# Patient Record
Sex: Female | Born: 2000 | Race: Black or African American | Hispanic: No | Marital: Single | Smoking: Never smoker
Health system: Southern US, Community
[De-identification: ages and names within clinical notes are randomized; demographics above are authoritative.]

---
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
## 2021-07-21 DIAGNOSIS — F312 Bipolar disorder, current episode manic severe with psychotic features: Secondary | ICD-10-CM | POA: Insufficient documentation

## 2021-08-01 ENCOUNTER — Ambulatory Visit (HOSPITAL_COMMUNITY)
Admission: EM | Admit: 2021-08-01 | Discharge: 2021-08-01 | Disposition: A | Discharge status: Other Acute Inpatient Hospital | Payer: 59 | Source: Home / Self Care

## 2021-08-01 ENCOUNTER — Emergency Department (HOSPITAL_COMMUNITY)
Admission: EM | Admit: 2021-08-01 | Discharge: 2021-08-02 | Disposition: A | Discharge status: Other Acute Inpatient Hospital | Payer: 59 | Source: Home / Self Care | Attending: Emergency Medicine | Admitting: Emergency Medicine

## 2021-08-01 DIAGNOSIS — Z79899 Other long term (current) drug therapy: Secondary | ICD-10-CM | POA: Insufficient documentation

## 2021-08-01 DIAGNOSIS — F3163 Bipolar disorder, current episode mixed, severe, without psychotic features: Secondary | ICD-10-CM | POA: Diagnosis not present

## 2021-08-01 DIAGNOSIS — Z046 Encounter for general psychiatric examination, requested by authority: Secondary | ICD-10-CM | POA: Insufficient documentation

## 2021-08-01 DIAGNOSIS — Z20822 Contact with and (suspected) exposure to covid-19: Secondary | ICD-10-CM | POA: Insufficient documentation

## 2021-08-01 DIAGNOSIS — F1290 Cannabis use, unspecified, uncomplicated: Secondary | ICD-10-CM | POA: Insufficient documentation

## 2021-08-01 DIAGNOSIS — F3164 Bipolar disorder, current episode mixed, severe, with psychotic features: Secondary | ICD-10-CM | POA: Insufficient documentation

## 2021-08-01 DIAGNOSIS — R000 Tachycardia, unspecified: Secondary | ICD-10-CM | POA: Insufficient documentation

## 2021-08-01 DIAGNOSIS — L03115 Cellulitis of right lower limb: Secondary | ICD-10-CM | POA: Insufficient documentation

## 2021-08-01 DIAGNOSIS — D72829 Elevated white blood cell count, unspecified: Secondary | ICD-10-CM | POA: Insufficient documentation

## 2021-08-01 DIAGNOSIS — R45851 Suicidal ideations: Secondary | ICD-10-CM | POA: Diagnosis not present

## 2021-08-01 LAB — COMPREHENSIVE METABOLIC PANEL
ALT: 25 U/L (ref 0–44)
AST: 31 U/L (ref 15–41)
Albumin: 4.5 g/dL (ref 3.5–5.0)
Alkaline Phosphatase: 49 U/L (ref 38–126)
Anion gap: 14 (ref 5–15)
BUN: 9 mg/dL (ref 6–20)
CO2: 22 mmol/L (ref 22–32)
Calcium: 10 mg/dL (ref 8.9–10.3)
Chloride: 103 mmol/L (ref 98–111)
Creatinine, Ser: 0.98 mg/dL (ref 0.44–1.00)
GFR, Estimated: >60 mL/min (ref >60)
Glucose, Bld: 78 mg/dL (ref 70–99)
Potassium: 3.9 mmol/L (ref 3.5–5.1)
Sodium: 139 mmol/L (ref 135–145)
Total Bilirubin: 0.9 mg/dL (ref 0.3–1.2)
Total Protein: 8.3 g/dL — ABNORMAL HIGH (ref 6.5–8.1)

## 2021-08-01 LAB — CBC WITH DIFFERENTIAL/PLATELET
Abs Immature Granulocytes: 0.06 10*3/uL (ref 0.00–0.07)
Basophils Absolute: 0 10*3/uL (ref 0.0–0.1)
Basophils Relative: 0 %
Eosinophils Absolute: 0 10*3/uL (ref 0.0–0.5)
Eosinophils Relative: 0 %
HCT: 39.8 % (ref 36.0–46.0)
Hemoglobin: 12.9 g/dL (ref 12.0–15.0)
Immature Granulocytes: 1 %
Lymphocytes Relative: 14 %
Lymphs Abs: 1.8 10*3/uL (ref 0.7–4.0)
MCH: 30.6 pg (ref 26.0–34.0)
MCHC: 32.4 g/dL (ref 30.0–36.0)
MCV: 94.3 fL (ref 80.0–100.0)
Monocytes Absolute: 1.4 10*3/uL — ABNORMAL HIGH (ref 0.1–1.0)
Monocytes Relative: 10 %
Neutro Abs: 9.8 10*3/uL — ABNORMAL HIGH (ref 1.7–7.7)
Neutrophils Relative %: 75 %
Platelets: 293 10*3/uL (ref 150–400)
RBC: 4.22 MIL/uL (ref 3.87–5.11)
RDW: 13.2 % (ref 11.5–15.5)
WBC: 13.1 10*3/uL — ABNORMAL HIGH (ref 4.0–10.5)
nRBC: 0 % (ref 0.0–0.2)

## 2021-08-01 LAB — POC SARS CORONAVIRUS 2 AG -  ED: SARS Coronavirus 2 Ag: NEGATIVE

## 2021-08-01 LAB — LIPID PANEL
Cholesterol: 199 mg/dL (ref 0–200)
HDL: 123 mg/dL (ref >40)
LDL Cholesterol: 68 mg/dL (ref 0–99)
Total CHOL/HDL Ratio: 1.6 RATIO
Triglycerides: 39 mg/dL (ref <150)
VLDL: 8 mg/dL (ref 0–40)

## 2021-08-01 LAB — TSH: TSH: 1.457 u[IU]/mL (ref 0.350–4.500)

## 2021-08-01 LAB — RESP PANEL BY RT-PCR (FLU A&B, COVID) ARPGX2
Influenza A by PCR: NEGATIVE
Influenza B by PCR: NEGATIVE
SARS Coronavirus 2 by RT PCR: NEGATIVE

## 2021-08-01 LAB — HEMOGLOBIN A1C
Hgb A1c MFr Bld: 5 % (ref 4.8–5.6)
Mean Plasma Glucose: 96.8 mg/dL

## 2021-08-01 LAB — MAGNESIUM: Magnesium: 2 mg/dL (ref 1.7–2.4)

## 2021-08-01 LAB — POC SARS CORONAVIRUS 2 AG: SARSCOV2ONAVIRUS 2 AG: NEGATIVE

## 2021-08-01 LAB — ETHANOL: Alcohol, Ethyl (B): <10 mg/dL (ref <10)

## 2021-08-01 LAB — LITHIUM LEVEL: Lithium Lvl: 0.52 mmol/L — ABNORMAL LOW (ref 0.60–1.20)

## 2021-08-01 MED ORDER — TRAZODONE HCL 50 MG PO TABS: 50.0000 mg | ORAL_TABLET | Freq: Every evening | ORAL | Status: DC | PRN

## 2021-08-01 MED ORDER — HYDROXYZINE HCL 25 MG PO TABS: 25.0000 mg | ORAL_TABLET | Freq: Three times a day (TID) | ORAL | Status: DC | PRN

## 2021-08-01 MED ORDER — OLANZAPINE 5 MG PO TABS
5.0000 mg | ORAL_TABLET | Freq: Two times a day (BID) | ORAL | Status: DC
  Administered 2021-08-01: 5 mg via ORAL
  Filled 2021-08-01: qty 1

## 2021-08-01 MED ORDER — CEPHALEXIN 250 MG PO CAPS
500.0000 mg | ORAL_CAPSULE | Freq: Once | ORAL | Status: AC
  Administered 2021-08-01: 500 mg via ORAL
  Filled 2021-08-01: qty 2

## 2021-08-01 MED ORDER — MAGNESIUM HYDROXIDE 400 MG/5ML PO SUSP: 30.0000 mL | Freq: Every day | ORAL | Status: DC | PRN

## 2021-08-01 MED ORDER — ALUM & MAG HYDROXIDE-SIMETH 200-200-20 MG/5ML PO SUSP: 30.0000 mL | ORAL | Status: DC | PRN

## 2021-08-01 MED ORDER — ACETAMINOPHEN 325 MG PO TABS: 650.0000 mg | ORAL_TABLET | Freq: Four times a day (QID) | ORAL | Status: DC | PRN

## 2021-08-01 NOTE — ED Notes (Signed)
Report given to [facility] [email or web address]  ED

## 2021-08-01 NOTE — ED Provider Notes (Addendum)
Behavioral Health Admission [facility])  Date: 08/01/21 Patient Name: Kaitlyn Cline MRN: [id number] Chief Complaint:  Chief Complaint  Patient presents with   IVC      Diagnoses:  Final diagnoses:  Bipolar 1 disorder, mixed, severe (HCC)    HPI: Kaitlyn Cline is a 20 year old female.  Patient presents involuntarily to [facility] behavioral health for walk-in assessment.  Patient is transported by police.  Involuntary commitment petition, initiated by roommate, Timothy Lasso,  reads: "Respondent has stated on multiple occasions she will kill herself.  Respondent has been prescribed lithium and mirtazapine.  According to respondent's roommate respondent is paranoid.  Respondent is very aggressive and hostile.  Threatens to fight all her roommates in the home.  Respondent also threatened to blow up the home.  Respondent moves through multiple personalities going from extremely angry to cold.  Respondent is a danger to themselves and others.  Patient is assessed face-to-face by nurse practitioner.  She is seated in assessment area, no acute distress.  She is alert and oriented, participates actively in assessment.  Kaitlyn states "I need help."  She states "mentally I am stressed." She denies making suicidal statements.  Recent stressors include school, she has recently attended [employer], reports she is premed and biology courses are "too rigorous."  She is not enrolled in university currently, plans to re-enroll for Spring 2023 semester. 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 She endorses history of "I do not know how to love, my family is an emotional roller coaster." She reports financial concerns and family and natural supports long distance in [place]. [place].   Kaitlyn has been diagnosed with bipolar 1 disorder.  She was discharged from inpatient psychiatric treatment [facility] 1 week ago.  She reports she has  been compliant with medications including lithium 300 mg twice daily and Remeron 15 mg nightly. She appears to make contradictory statements regarding medication compliance.  She is a poor historian regarding outpatient psychiatry follow-up at this time.  She presents with tangential conversation states "I am not a racist."  She appears concerned versus paranoid regarding her current medications.  She states "I am too young for lithium, it is a strong medication."  Additionally she reports she has a small abrasion to her right hip, attributes this to lithium. Kaitlyn reports apparent paranoid ideations states "I feel like a sore is coming up on my arm." She exposes right forearm, skin appears intact.   Kaitlyn depressed and anxious mood, tearful affect. She denies suicidal and homicidal ideations.  She denies history of suicide attempts, denies history of self-harm.   She denies both auditory and visual hallucinations.  Patient is able to converse coherently with goal-directed thoughts.     Kaitlyn resides in [place] with roommates, she denies access to weapons. She is employed in [employer], currently on leave of absence that began on 07/22/2021. She denies alcohol and substance use. Reports typically uses marijuana, last use marijuana on 07/27/2021. Patient endorses average sleep and appetite.  Patient offered support and encouragement.    PHQ 2-9:     Total Time spent with patient: 30 minutes  Musculoskeletal  Strength & Muscle Tone: within normal limits Gait & Station: normal Patient leans: N/A  Psychiatric Specialty Exam  Presentation General Appearance: Appropriate for Environment; Casual  Eye Contact:Good  Speech:Clear and Coherent; Normal Rate  Speech Volume:Normal  Handedness:Right   Mood and Affect  Mood:Anxious;  Depressed  Affect:Tearful   Thought Process  Thought Processes:Coherent; Goal Directed  Descriptions of Associations:Tangential  Orientation:Full  (Time, Place and Person)  Thought Content:Logical; Tangential    Hallucinations:Hallucinations: None  Ideas of Reference:None  Suicidal Thoughts:Suicidal Thoughts: No  Homicidal Thoughts:Homicidal Thoughts: No   Sensorium  Memory:Immediate Good; Recent Fair; Remote Fair  Judgment:Fair  Insight:Shallow   Executive Functions  Concentration:Fair  Attention Span:Fair  Recall:Good  Fund of Knowledge:Good  Language:Good   Psychomotor Activity  Psychomotor Activity:Psychomotor Activity: Normal   Assets  Assets:Communication Skills; Desire for Improvement; Housing; Intimacy; Leisure Time; Physical Health; Resilience; Social Support   Sleep  Sleep:Sleep: Fair   Nutritional Assessment (For OBS and [facility] admissions only) Has the patient had a weight loss or gain of 10 pounds or more in the last 3 months?: No Has the patient had a decrease in food intake/or appetite?: No Does the patient have dental problems?: No Does the patient have eating habits or behaviors that may be indicators of an eating disorder including binging or inducing vomiting?: No Has the patient recently lost weight without trying?: 0 Has the patient been eating poorly because of a decreased appetite?: 0 Malnutrition Screening Tool Score: 0   Physical Exam Vitals and nursing note reviewed.  Constitutional:      Appearance: Normal appearance. She is well-developed and normal weight.  HENT:     Head: Normocephalic and atraumatic.     Nose: Nose normal.  Cardiovascular:     Rate and Rhythm: Normal rate.  Pulmonary:     Effort: Pulmonary effort is normal.  Musculoskeletal:        General: Normal range of motion.     Cervical back: Normal range of motion.  Skin:    General: Skin is warm and dry.     Comments: Small abrasion noted to anterior right hip  Neurological:     Mental Status: She is alert and oriented to person, place, and time.  Psychiatric:        Attention and Perception:  Attention and perception normal.        Mood and Affect: Mood is anxious and depressed. Affect is tearful.        Speech: Speech is tangential.        Behavior: Behavior is cooperative.        Thought Content: Thought content is paranoid.        Cognition and Memory: Cognition and memory normal.   Review of Systems  Constitutional: Negative.   HENT: Negative.    Eyes: Negative.   Respiratory: Negative.    Cardiovascular: Negative.   Gastrointestinal: Negative.   Genitourinary: Negative.   Musculoskeletal: Negative.   Skin: Negative.   Neurological: Negative.   Endo/Heme/Allergies: Negative.   Psychiatric/Behavioral:  Positive for depression. The patient is nervous/anxious.    Blood pressure 133/80, pulse (!) 118, temperature 99 F (37.2 C), temperature source Oral, resp. rate 20, SpO2 98 %. There is no height or weight on file to calculate BMI.  Past Psychiatric History: bipolar 1 disorder, cannabis use disorder   Is the patient at risk to self? No  Has the patient been a risk to self in the past 6 months? No .    Has the patient been a risk to self within the distant past? No   Is the patient a risk to others? No   Has the patient been a risk to others in the past 6 months? No   Has the patient been a risk  to others within the distant past? No   Past Medical History: No past medical history on file.  Family History: No family history on file.  Social History:  Social History   Socioeconomic History   Marital status: Single    Spouse name: Not on file   Number of children: Not on file   Years of education: Not on file   Highest education level: Not on file  Occupational History   Not on file  Tobacco Use   Smoking status: Not on file   Smokeless tobacco: Not on file  Substance and Sexual Activity   Alcohol use: Not on file   Drug use: Not on file   Sexual activity: Not on file  Other Topics Concern   Not on file  Social History Narrative   Not on file    Social Determinants of Health   Financial Resource Strain: Not on file  Food Insecurity: Not on file  Transportation Needs: Not on file  Physical Activity: Not on file  Stress: Not on file  Social Connections: Not on file  Intimate Partner Violence: Not on file    SDOH:  SDOH Screenings   Alcohol Screen: Not on file  Depression (PHQ2-9): Not on file  Financial Resource Strain: Not on file  Food Insecurity: Not on file  Housing: Not on file  Physical Activity: Not on file  Social Connections: Not on file  Stress: Not on file  Tobacco Use: Not on file  Transportation Needs: Not on file    Last Labs:  No results found for any previous visit.    Allergies: Patient has no allergy information on record.  PTA Medications: (Not in a hospital admission)   Medical Decision Making  Patient reviewed with Dr. Bronwen Betters.  Inpatient psychiatric treatment recommended.  Patient remains under involuntary commitment petition. Kaitlyn will be placed on continuous observation unit at [facility] behavioral health while awaiting inpatient psychiatric admission.  Laboratory studies ordered including CBC, CMP, ethanol, A1c, hepatic function, lipid panel, magnesium and TSH.  Urine pregnancy and urine drug screen ordered.  EKG order initiated.  Current medications: -Acetaminophen 650 mg every 6 as needed/mild pain -Maalox 30 mL oral every 4 as needed/digestion -Hydroxyzine 25 mg 3 times daily as needed/anxiety -Magnesium hydroxide 30 mL daily as needed/mild constipation -Trazodone 50 mg nightly as needed/sleep -Olanzapine 5mg  BID/mood      Recommendations  Based on my evaluation the patient appears to have an emergency medical condition for which I recommend the patient be transferred to the emergency department for further evaluation. Patient with elevated heart rate and abrasion to right hip. Patient reports tenderness and warmth to right hip. Abrasion noted to be raised and reddened.  Patient will be transported to emergency department for medical clearance. Once medically cleared she can return to [facility] health to await inpatient psychiatric admission. Patient verbalizes agreement with plan for emergency department evaluation.    [facility], FNP 08/01/21  8:11 PM
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
## 2021-08-01 NOTE — ED Notes (Signed)
Dash called for stat pickup to  lab 

## 2021-08-01 NOTE — Progress Notes (Signed)
Inpatient Behavioral Health Placement  Pt meets inpatient criteria per Doran Heater, FNP.  Per [facility] Fransico Michael, RN there are no appropriate beds.  Referral was sent to the following facilities;   Destination Service Provider Address Phone Fax  [facility]  [address] [place] [address] [phone]  [facility] [address]  [address] [place] [address] [phone]  [facility]  [address] [place], [place] [address] [phone]  [facility]  [address] [place] [address] [phone]  [facility]  [address] [place] [address] [place] [address] [phone]  [facility]  [address] [place] [address] [phone]  [facility]  [address], [place] [address] [phone]  [facility]  [address], [place] [address] [phone]  [facility]  [address], [place] [address] [phone]  [facility]  [address] [place] [address] [phone]  [facility]  [address], [place] [address] [phone]  [facility]  [address], [place] [address] [phone]  [facility]  [address], [place] [address] [phone]  [facility]  [address] [place], [place] [address] [phone]  [facility]  [address], [place] [address] [phone]  [facility]  [address] [place] [address] [phone]    Situation ongoing,  CSW will follow up.   Maryjean Ka,  MSW, [facility] 08/01/2021  @ 11:48 PM

## 2021-08-01 NOTE — ED Notes (Signed)
Pt belongings are still at [facility] until pt return. Pt was given 5mg  of zyprexa
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
## 2021-08-01 NOTE — ED Triage Notes (Signed)
Patient came from [facility]. Received IM in right leg. Cx painful burning around injection site.

## 2021-08-01 NOTE — ED Provider Notes (Signed)
[facility] EMERGENCY DEPARTMENT Provider Note   CSN: [id number] Arrival date & time: 08/01/21  2210     History Chief Complaint  Patient presents with   Leg Pain    Kaitlyn Cline is a 20 y.o. female.  20 year old female with a history of bipolar 1 disorder presents from [facility] for evaluation of R hip wound. Patient reports wound being present x 2 days. It has been constant, worsening; has become more red and swollen over the past day. Describes a "burning" pain associated with her wound. Notes a blister to the area which has popped. Cannot tell me how the wound began; alleges that it erupted because her medication dosages were "too high". Denies fever, drainage. Presently under IVC awaiting psychiatric placement.  The history is provided by the patient and medical records. No language interpreter was used.  Leg Pain     No past medical history on file.  There are no problems to display for this patient.   ** The histories are not reviewed yet. Please review them in the "History" navigator section and refresh this SmartLink.   OB History   No obstetric history on file.     No family history on file.     Home Medications Prior to Admission medications   Not on File    Allergies    Patient has no allergy information on record.  Review of Systems   Review of Systems Ten systems reviewed and are negative for acute change, except as noted in the HPI.    Physical Exam Updated Vital Signs BP 118/79 (BP Location: Right Arm)   Pulse 99   Temp 98.1 F (36.7 C) (Oral)   Resp 16   SpO2 100%   Physical Exam Vitals and nursing note reviewed.  Constitutional:      General: She is not in acute distress.    Appearance: She is well-developed. She is not diaphoretic.     Comments: Nontoxic appearing and in NAD  HENT:     Head: Normocephalic and atraumatic.  Eyes:     General: No scleral icterus.    Conjunctiva/sclera: Conjunctivae normal.   Cardiovascular:     Comments: Mild tachycardia Pulmonary:     Effort: Pulmonary effort is normal. No respiratory distress.  Musculoskeletal:        General: Normal range of motion.     Cervical back: Normal range of motion.     Comments: Preserved ROM of the RLE. Area of induration to the lateral R thigh/hip with associated erythema approximately 4cm in diameter. Warmth noted with small area of central desquamation c/w reported popped blister. No purulence, weeping, fluctuance, lymphangitic streaking.  Skin:    General: Skin is warm and dry.     Coloration: Skin is not pale.     Findings: No rash.  Neurological:     Mental Status: She is alert and oriented to person, place, and time.  Psychiatric:        Behavior: Behavior normal.    ED Results / Procedures / Treatments   Labs (all labs ordered are listed, but only abnormal results are displayed) Labs Reviewed  I-STAT BETA HCG BLOOD, ED ([facility], [facility], [facility] ONLY)    EKG None  Radiology No results found.  Procedures Procedures   Medications Ordered in ED Medications  cephALEXin (KEFLEX) capsule 500 mg (has no administration in time range)    ED Course  I have reviewed the triage vital signs and the nursing notes.  Pertinent  labs & imaging results that were available during my care of the patient were reviewed by me and considered in my medical decision making (see chart for details).    MDM Rules/Calculators/A&P                           Sent from [facility] for assessment of wound to R thigh x 2 days and medical clearance. Presently under IVC pending psych placement.  Based on chart review, new leukocytosis of 13.1 compared to 07/21/21. Tachycardic at [facility], but improving HR now. Unclear how much of these changes may be related to psychiatric crisis. Physical exam certainly suggestive of cellulitic process, though patient does not appear overtly toxic/septic. Started on Keflex PO in the ED. No physical exam findings suggestive of  drainable abscess. Will ensure no subcutaneous emphysema; xray ordered. Care signed out to [place], [place] at shift change pending reassessment. Stable for clearance back to [facility] on course of PO abx if imaging reassuring.   Final Clinical Impression(s) / ED Diagnoses Final diagnoses:  Cellulitis of right lower extremity    Rx / DC Orders ED Discharge Orders     None        Antony Madura, PA-C 08/01/21 2325    Arby Barrette, MD 08/03/21 1554

## 2021-08-02 ENCOUNTER — Ambulatory Visit (HOSPITAL_COMMUNITY)
Admission: EM | Admit: 2021-08-02 | Discharge: 2021-08-02 | Disposition: A | Discharge status: Other Acute Inpatient Hospital | Payer: 59 | Source: Home / Self Care

## 2021-08-02 ENCOUNTER — Encounter (HOSPITAL_COMMUNITY): Payer: Self-pay | Admitting: Student

## 2021-08-02 ENCOUNTER — Inpatient Hospital Stay (HOSPITAL_COMMUNITY)
Admission: AD | Admit: 2021-08-02 | Discharge: 2021-08-08 | DRG: 885 | Disposition: A | Discharge status: Home / Self Care | Payer: 59 | Source: Intra-hospital | Attending: Psychiatry | Admitting: Psychiatry

## 2021-08-02 ENCOUNTER — Emergency Department (HOSPITAL_COMMUNITY): Payer: 59

## 2021-08-02 ENCOUNTER — Other Ambulatory Visit: Payer: Self-pay

## 2021-08-02 ENCOUNTER — Encounter (HOSPITAL_COMMUNITY): Payer: Self-pay | Admitting: Psychiatry

## 2021-08-02 DIAGNOSIS — L03115 Cellulitis of right lower limb: Secondary | ICD-10-CM | POA: Diagnosis present

## 2021-08-02 DIAGNOSIS — F3110 Bipolar disorder, current episode manic without psychotic features, unspecified: Secondary | ICD-10-CM | POA: Diagnosis not present

## 2021-08-02 DIAGNOSIS — K30 Functional dyspepsia: Secondary | ICD-10-CM | POA: Diagnosis present

## 2021-08-02 DIAGNOSIS — Z20822 Contact with and (suspected) exposure to covid-19: Secondary | ICD-10-CM | POA: Diagnosis present

## 2021-08-02 DIAGNOSIS — F1290 Cannabis use, unspecified, uncomplicated: Secondary | ICD-10-CM | POA: Diagnosis present

## 2021-08-02 DIAGNOSIS — F3163 Bipolar disorder, current episode mixed, severe, without psychotic features: Secondary | ICD-10-CM

## 2021-08-02 DIAGNOSIS — Z79899 Other long term (current) drug therapy: Secondary | ICD-10-CM | POA: Insufficient documentation

## 2021-08-02 DIAGNOSIS — R000 Tachycardia, unspecified: Secondary | ICD-10-CM | POA: Insufficient documentation

## 2021-08-02 DIAGNOSIS — R45851 Suicidal ideations: Secondary | ICD-10-CM | POA: Diagnosis present

## 2021-08-02 DIAGNOSIS — F312 Bipolar disorder, current episode manic severe with psychotic features: Secondary | ICD-10-CM

## 2021-08-02 DIAGNOSIS — F319 Bipolar disorder, unspecified: Secondary | ICD-10-CM | POA: Diagnosis not present

## 2021-08-02 DIAGNOSIS — F419 Anxiety disorder, unspecified: Secondary | ICD-10-CM | POA: Diagnosis present

## 2021-08-02 DIAGNOSIS — F3164 Bipolar disorder, current episode mixed, severe, with psychotic features: Secondary | ICD-10-CM | POA: Diagnosis present

## 2021-08-02 DIAGNOSIS — D72829 Elevated white blood cell count, unspecified: Secondary | ICD-10-CM | POA: Diagnosis present

## 2021-08-02 LAB — I-STAT BETA HCG BLOOD, ED (MC, WL, AP ONLY): I-stat hCG, quantitative: 5 m[IU]/mL — ABNORMAL HIGH (ref <5)

## 2021-08-02 MED ORDER — OLANZAPINE 5 MG PO TABS
5.0000 mg | ORAL_TABLET | Freq: Two times a day (BID) | ORAL | Status: DC
  Administered 2021-08-02: 5 mg via ORAL
  Filled 2021-08-02: qty 1

## 2021-08-02 MED ORDER — TRAZODONE HCL 50 MG PO TABS: 50.0000 mg | ORAL_TABLET | Freq: Every evening | ORAL | Status: DC | PRN

## 2021-08-02 MED ORDER — CEPHALEXIN 500 MG PO CAPS
500.0000 mg | ORAL_CAPSULE | Freq: Two times a day (BID) | ORAL | Status: DC
  Administered 2021-08-03 – 2021-08-08 (×12): 500 mg via ORAL
  Filled 2021-08-02 (×6): qty 2
  Filled 2021-08-02: qty 1
  Filled 2021-08-02: qty 2
  Filled 2021-08-02: qty 1
  Filled 2021-08-02 (×5): qty 2

## 2021-08-02 MED ORDER — HYDROXYZINE HCL 25 MG PO TABS: 25.0000 mg | ORAL_TABLET | Freq: Three times a day (TID) | ORAL | Status: DC | PRN

## 2021-08-02 MED ORDER — HYDROXYZINE HCL 25 MG PO TABS
25.0000 mg | ORAL_TABLET | Freq: Three times a day (TID) | ORAL | Status: DC | PRN
  Administered 2021-08-05: 25 mg via ORAL
  Filled 2021-08-02: qty 10
  Filled 2021-08-02: qty 1

## 2021-08-02 MED ORDER — MAGNESIUM HYDROXIDE 400 MG/5ML PO SUSP: 30.0000 mL | Freq: Every day | ORAL | Status: DC | PRN

## 2021-08-02 MED ORDER — BACITRACIN ZINC 500 UNIT/GM EX OINT
1.0000 "application " | TOPICAL_OINTMENT | Freq: Two times a day (BID) | CUTANEOUS | Status: DC
  Administered 2021-08-02: 1 via TOPICAL
  Filled 2021-08-02: qty 28.35

## 2021-08-02 MED ORDER — ALUM & MAG HYDROXIDE-SIMETH 200-200-20 MG/5ML PO SUSP: 30.0000 mL | ORAL | Status: DC | PRN

## 2021-08-02 MED ORDER — OLANZAPINE 5 MG PO TBDP: 5.0000 mg | ORAL_TABLET | Freq: Three times a day (TID) | ORAL | Status: DC | PRN

## 2021-08-02 MED ORDER — LORAZEPAM 1 MG PO TABS
1.0000 mg | ORAL_TABLET | ORAL | Status: AC | PRN
  Administered 2021-08-02: 1 mg via ORAL
  Filled 2021-08-02: qty 1

## 2021-08-02 MED ORDER — BACITRACIN ZINC 500 UNIT/GM EX OINT
1.0000 "application " | TOPICAL_OINTMENT | Freq: Two times a day (BID) | CUTANEOUS | Status: DC
  Administered 2021-08-02: 1 via TOPICAL
  Filled 2021-08-02: qty 0.9

## 2021-08-02 MED ORDER — ZIPRASIDONE MESYLATE 20 MG IM SOLR: 20.0000 mg | INTRAMUSCULAR | Status: DC | PRN

## 2021-08-02 MED ORDER — CEPHALEXIN 250 MG PO CAPS
500.0000 mg | ORAL_CAPSULE | Freq: Two times a day (BID) | ORAL | Status: DC
  Administered 2021-08-02: 500 mg via ORAL
  Filled 2021-08-02: qty 2

## 2021-08-02 MED ORDER — LORAZEPAM 1 MG PO TABS: 1.0000 mg | ORAL_TABLET | ORAL | Status: DC | PRN

## 2021-08-02 MED ORDER — CEPHALEXIN 500 MG PO CAPS: 500.0000 mg | ORAL_CAPSULE | Freq: Two times a day (BID) | ORAL | 0 refills | Status: DC

## 2021-08-02 MED ORDER — OLANZAPINE 5 MG PO TBDP
5.0000 mg | ORAL_TABLET | Freq: Two times a day (BID) | ORAL | Status: DC
  Administered 2021-08-02 – 2021-08-03 (×2): 5 mg via ORAL
  Filled 2021-08-02 (×7): qty 1

## 2021-08-02 MED ORDER — ACETAMINOPHEN 325 MG PO TABS: 650.0000 mg | ORAL_TABLET | Freq: Four times a day (QID) | ORAL | Status: DC | PRN

## 2021-08-02 MED ORDER — ACETAMINOPHEN 325 MG PO TABS
650.0000 mg | ORAL_TABLET | Freq: Four times a day (QID) | ORAL | Status: DC | PRN
  Administered 2021-08-04: 650 mg via ORAL
  Filled 2021-08-02: qty 2

## 2021-08-02 NOTE — ED Provider Notes (Addendum)
Behavioral Health Admission H&P [facility] & OBS)  Date: 08/02/21 Patient Name: Kaitlyn Cline MRN: [id number] Chief Complaint:  Chief Complaint  Patient presents with   IVC    Suicidal/Paranoia      Diagnoses:  Final diagnoses:  Bipolar 1 disorder, mixed, severe (HCC)  Cellulitis of right lower extremity    HPI: Kaitlyn Cline is a 20 year old female with past psychiatric history significant for bipolar 1 disorder and cannabis use disorder who presents back to the [facility] behavioral health urgent care [facility]) after being transferred to [facility] emergency department for medical clearance/evaluation of right hip lesion/abrasion.  Patient presented to the [facility] earlier last night on 08/02/2021 via law enforcement under IVC.  Patient was evaluated by TTS and psychiatric provider at that time, first exam was completed by psychiatric provider at that time, IVC was upheld, and inpatient psychiatric treatment was recommended for the patient at that time.  Patient was then transferred to [facility] ED for medical clearance/evaluation of right hip lesion/abrasion.  Patient was evaluated in [facility] ED and was determined to have cellulitis of this right hip lesion/abrasion and patient was noted to have leukocytosis of white blood cell count of 13.1 K/uL, but did not appear toxic or septic, patient was noted to not have a drainable abscess and tachycardia improved during ED visit.  Chart review of 08/01/2021 ED visit also shows that patient had x-ray of right hip/pelvis which showed no acute findings or signs of subcutaneous emphysema.  Patient was started on Keflex in the ED: Received a one-time dose of Keflex 500 mg on 08/01/2021 at 2348 and was initiated on 7-day course of Keflex 500 mg p.o. every 12 hours for cellulitis.  Patient also was initiated on bacitracin ointment twice daily in the ED for this lesion as well.  Patient was medically cleared in the ED/determined medically stable for transfer back to [facility]  from the ED. Patient now arrives back to [facility] to be admitted to continuous assessment for further crisis stabilization and treatment while waiting for placement for inpatient psychiatric treatment.  Per Doran Heater, NP earlier 08/01/21 [facility] Admission H&P HPI:   "Kaitlyn Cline is a 20 year old female.  Patient presents involuntarily to [facility] behavioral health for walk-in assessment.  Patient is transported by police.   Involuntary commitment petition, initiated by roommate, Timothy Lasso,  reads: "Respondent has stated on multiple occasions she will kill herself.  Respondent has been prescribed lithium and mirtazapine.  According to respondent's roommate respondent is paranoid.  Respondent is very aggressive and hostile.  Threatens to fight all her roommates in the home.  Respondent also threatened to blow up the home.  Respondent moves through multiple personalities going from extremely angry to cold.  Respondent is a danger to themselves and others.   Patient is assessed face-to-face by nurse practitioner.  She is seated in assessment area, no acute distress.  She is alert and oriented, participates actively in assessment.   Kaitlyn states "I need help."  She states "mentally I am stressed." She denies making suicidal statements.   Recent stressors include school, she has recently attended [employer], reports she is premed and biology courses are "too rigorous."  She is not enrolled in university currently, plans to re-enroll for Spring 2023 semester. 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 She endorses history of "I do not know how to love, my family is an emotional roller coaster." She reports financial  concerns and family and natural supports long distance in [place]. [place].    Kaitlyn has been diagnosed with bipolar 1 disorder.  She was discharged from inpatient psychiatric treatment [facility] 1 week ago.  She reports she  has been compliant with medications including lithium 300 mg twice daily and Remeron 15 mg nightly. She appears to make contradictory statements regarding medication compliance.  She is a poor historian regarding outpatient psychiatry follow-up at this time.   She presents with tangential conversation states "I am not a racist."  She appears concerned versus paranoid regarding her current medications.  She states "I am too young for lithium, it is a strong medication."  Additionally she reports she has a small abrasion to her right hip, attributes this to lithium. Kaitlyn reports apparent paranoid ideations states "I feel like a sore is coming up on my arm." She exposes right forearm, skin appears intact.    Kaitlyn depressed and anxious mood, tearful affect. She denies suicidal and homicidal ideations.  She denies history of suicide attempts, denies history of self-harm.    She denies both auditory and visual hallucinations.  Patient is able to converse coherently with goal-directed thoughts.      Kaitlyn resides in [place] with roommates, she denies access to weapons. She is employed in [employer], currently on leave of absence that began on 07/22/2021. She denies alcohol and substance use. Reports typically uses marijuana, last use marijuana on 07/27/2021. Patient endorses average sleep and appetite.   Patient offered support and encouragement."     PHQ 2-9:   Flowsheet Row ED from 08/02/2021 in [facility]  C-SSRS RISK CATEGORY High Risk        Total Time spent with patient: 30 minutes  Musculoskeletal  Strength & Muscle Tone: within normal limits Gait & Station: normal Patient leans: N/A  Psychiatric Specialty Exam  Presentation General Appearance: Appropriate for Environment; Casual  Eye Contact:Good  Speech:Clear and Coherent; Normal Rate  Speech Volume:Normal  Handedness:Right   Mood and Affect  Mood:Anxious;  Depressed  Affect:Tearful   Thought Process  Thought Processes:Coherent; Goal Directed  Descriptions of Associations:Tangential  Orientation:Full (Time, Place and Person)  Thought Content:Logical; Tangential    Hallucinations:Hallucinations: None  Ideas of Reference:None  Suicidal Thoughts:Suicidal Thoughts: No  Homicidal Thoughts:Homicidal Thoughts: No   Sensorium  Memory:Immediate Good; Recent Fair; Remote Fair  Judgment:Fair  Insight:Shallow   Executive Functions  Concentration:Fair  Attention Span:Fair  Recall:Good  Fund of Knowledge:Good  Language:Good   Psychomotor Activity  Psychomotor Activity:Psychomotor Activity: Normal   Assets  Assets:Communication Skills; Desire for Improvement; Housing; Intimacy; Leisure Time; Physical Health; Resilience; Social Support   Sleep  Sleep:Sleep: Fair   Nutritional Assessment (For OBS and FBC admissions only) Has the patient had a weight loss or gain of 10 pounds or more in the last 3 months?: No Has the patient had a decrease in food intake/or appetite?: No Does the patient have dental problems?: No Does the patient have eating habits or behaviors that may be indicators of an eating disorder including binging or inducing vomiting?: No Has the patient recently lost weight without trying?: 0 Has the patient been eating poorly because of a decreased appetite?: 0 Malnutrition Screening Tool Score: 0   Physical Exam Vitals reviewed.  Constitutional:      General: She is not in acute distress.    Appearance: She is not ill-appearing, toxic-appearing or diaphoretic.  HENT:     Head: Normocephalic and atraumatic.  Right Ear: External ear normal.     Left Ear: External ear normal.  Eyes:     General:        Right eye: No discharge.        Left eye: No discharge.     Conjunctiva/sclera: Conjunctivae normal.  Cardiovascular:     Rate and Rhythm: Normal rate.  Pulmonary:     Effort: Pulmonary effort  is normal. No respiratory distress.  Musculoskeletal:        General: Normal range of motion.     Cervical back: Normal range of motion.  Skin:    Comments: Lesion noted in the right anterior hip with noted warmth and erythema to the lesion, with no apparent drainage of the lesion noted at this time.    Neurological:     General: No focal deficit present.     Mental Status: She is alert and oriented to person, place, and time.  Psychiatric:        Attention and Perception: Attention and perception normal.        Mood and Affect: Mood is anxious and depressed.        Speech: Speech is tangential.        Behavior: Behavior normal. Behavior is cooperative.        Thought Content: Thought content is paranoid.        Cognition and Memory: Cognition and memory normal.   Review of Systems  Constitutional:  Negative for chills, diaphoresis, fever, malaise/fatigue and weight loss.  HENT:  Negative for congestion.   Respiratory:  Negative for cough and shortness of breath.   Cardiovascular:  Negative for chest pain and palpitations.  Gastrointestinal:  Negative for abdominal pain, constipation, diarrhea, nausea and vomiting.  Musculoskeletal:  Negative for joint pain and myalgias.  Skin:        + for lesion on right hip.   Neurological:  Negative for dizziness, tremors, seizures and headaches.  Psychiatric/Behavioral:  Positive for depression. The patient is nervous/anxious.   All other systems reviewed and are negative.  Vitals: Blood pressure 128/63, pulse 89, temperature 98.1 F (36.7 C), temperature source Oral, resp. rate 18, SpO2 100 %. There is no height or weight on file to calculate BMI.  Past Psychiatric History: bipolar 1 disorder, cannabis use disorder      Is the patient at risk to self? No  Has the patient been a risk to self in the past 6 months? No .    Has the patient been a risk to self within the distant past? No   Is the patient a risk to others? No   Has the  patient been a risk to others in the past 6 months? No   Has the patient been a risk to others within the distant past? No   Past Medical History: History reviewed. No pertinent past medical history. History reviewed. No pertinent surgical history.  Family History: History reviewed. No pertinent family history.  Social History:  Social History   Socioeconomic History   Marital status: Single    Spouse name: Not on file   Number of children: Not on file   Years of education: Not on file   Highest education level: Not on file  Occupational History   Not on file  Tobacco Use   Smoking status: Not on file   Smokeless tobacco: Not on file  Substance and Sexual Activity   Alcohol use: Not on file   Drug use: Not on file  Sexual activity: Not on file  Other Topics Concern   Not on file  Social History Narrative   Not on file   Social Determinants of Health   Financial Resource Strain: Not on file  Food Insecurity: Not on file  Transportation Needs: Not on file  Physical Activity: Not on file  Stress: Not on file  Social Connections: Not on file  Intimate Partner Violence: Not on file    SDOH:  SDOH Screenings   Alcohol Screen: Not on file  Depression (ERX5-4): Not on file  Financial Resource Strain: Not on file  Food Insecurity: Not on file  Housing: Not on file  Physical Activity: Not on file  Social Connections: Not on file  Stress: Not on file  Tobacco Use: Not on file  Transportation Needs: Not on file    Last Labs:  Admission on 08/01/2021, Discharged on 08/02/2021  Component Date Value Ref Range Status   I-stat hCG, quantitative 08/01/2021 5.0 (H)  <5 mIU/mL Final   Comment 3 08/01/2021          Final   Comment:   GEST. AGE      CONC.  (mIU/mL)   <=1 WEEK        5 - 50     2 WEEKS       50 - 500     3 WEEKS       100 - 10,000     4 WEEKS     1,000 - 30,000        FEMALE AND NON-PREGNANT FEMALE:     LESS THAN 5 mIU/mL   Admission on 08/01/2021,  Discharged on 08/01/2021  Component Date Value Ref Range Status   SARS Coronavirus 2 by RT PCR 08/01/2021 NEGATIVE  NEGATIVE Final   Comment: (NOTE) SARS-CoV-2 target nucleic acids are NOT DETECTED.  The SARS-CoV-2 RNA is generally detectable in upper respiratory specimens during the acute phase of infection. The lowest concentration of SARS-CoV-2 viral copies this assay can detect is 138 copies/mL. A negative result does not preclude SARS-Cov-2 infection and should not be used as the sole basis for treatment or other patient management decisions. A negative result may occur with  improper specimen collection/handling, submission of specimen other than nasopharyngeal swab, presence of viral mutation(s) within the areas targeted by this assay, and inadequate number of viral copies(<138 copies/mL). A negative result must be combined with clinical observations, patient history, and epidemiological information. The expected result is Negative.  Fact Sheet for Patients:  BloggerCourse.com  Fact Sheet for Healthcare Providers:  SeriousBroker.it  This test is no                          t yet approved or cleared by the [place] FDA and  has been authorized for detection and/or diagnosis of SARS-CoV-2 by FDA under an Emergency Use Authorization (EUA). This EUA will remain  in effect (meaning this test can be used) for the duration of the COVID-19 declaration under Section 564(b)(1) of the Act, 21 U.S.C.section 360bbb-3(b)(1), unless the authorization is terminated  or revoked sooner.       Influenza A by PCR 08/01/2021 NEGATIVE  NEGATIVE Final   Influenza B by PCR 08/01/2021 NEGATIVE  NEGATIVE Final   Comment: (NOTE) The Xpert Xpress SARS-CoV-2/FLU/RSV plus assay is intended as an aid in the diagnosis of influenza from Nasopharyngeal swab specimens and should not be used as a sole basis for treatment. Nasal  washings and aspirates  are unacceptable for Xpert Xpress SARS-CoV-2/FLU/RSV testing.  Fact Sheet for Patients: BloggerCourse.com  Fact Sheet for Healthcare Providers: SeriousBroker.it  This test is not yet approved or cleared by the [place] FDA and has been authorized for detection and/or diagnosis of SARS-CoV-2 by FDA under an Emergency Use Authorization (EUA). This EUA will remain in effect (meaning this test can be used) for the duration of the COVID-19 declaration under Section 564(b)(1) of the Act, 21 U.S.C. section 360bbb-3(b)(1), unless the authorization is terminated or revoked.  Performed at [facility], [address] [place], [place] [address]    WBC 08/01/2021 13.1 (H)  4.0 - 10.5 K/uL Final   RBC 08/01/2021 4.22  3.87 - 5.11 MIL/uL Final   Hemoglobin 08/01/2021 12.9  12.0 - 15.0 g/dL Final   HCT 60/45/4098 39.8  36.0 - 46.0 % Final   MCV 08/01/2021 94.3  80.0 - 100.0 fL Final   MCH 08/01/2021 30.6  26.0 - 34.0 pg Final   MCHC 08/01/2021 32.4  30.0 - 36.0 g/dL Final   RDW 11/91/4782 13.2  11.5 - 15.5 % Final   Platelets 08/01/2021 293  150 - 400 K/uL Final   nRBC 08/01/2021 0.0  0.0 - 0.2 % Final   Neutrophils Relative % 08/01/2021 75  % Final   Neutro Abs 08/01/2021 9.8 (H)  1.7 - 7.7 K/uL Final   Lymphocytes Relative 08/01/2021 14  % Final   Lymphs Abs 08/01/2021 1.8  0.7 - 4.0 K/uL Final   Monocytes Relative 08/01/2021 10  % Final   Monocytes Absolute 08/01/2021 1.4 (H)  0.1 - 1.0 K/uL Final   Eosinophils Relative 08/01/2021 0  % Final   Eosinophils Absolute 08/01/2021 0.0  0.0 - 0.5 K/uL Final   Basophils Relative 08/01/2021 0  % Final   Basophils Absolute 08/01/2021 0.0  0.0 - 0.1 K/uL Final   Immature Granulocytes 08/01/2021 1  % Final   Abs Immature Granulocytes 08/01/2021 0.06  0.00 - 0.07 K/uL Final   Performed at [facility], [address], [place], [place] [address]   Sodium 08/01/2021 139  135 - 145  mmol/L Final   Potassium 08/01/2021 3.9  3.5 - 5.1 mmol/L Final   Chloride 08/01/2021 103  98 - 111 mmol/L Final   CO2 08/01/2021 22  22 - 32 mmol/L Final   Glucose, Bld 08/01/2021 78  70 - 99 mg/dL Final   Glucose reference range applies only to samples taken after fasting for at least 8 hours.   BUN 08/01/2021 9  6 - 20 mg/dL Final   Creatinine, Ser 08/01/2021 0.98  0.44 - 1.00 mg/dL Final   Calcium 30/86/5784 10.0  8.9 - 10.3 mg/dL Final   Total Protein 69/62/9528 8.3 (H)  6.5 - 8.1 g/dL Final   Albumin 41/32/4401 4.5  3.5 - 5.0 g/dL Final   AST 02/72/5366 31  15 - 41 U/L Final   ALT 08/01/2021 25  0 - 44 U/L Final   Alkaline Phosphatase 08/01/2021 49  38 - 126 U/L Final   Total Bilirubin 08/01/2021 0.9  0.3 - 1.2 mg/dL Final   GFR, Estimated 08/01/2021 >60  >60 mL/min Final   Comment: (NOTE) Calculated using the CKD-EPI Creatinine Equation (2021)    Anion gap 08/01/2021 14  5 - 15 Final   Performed at [facility], [address], [place], [place] [address]   Hgb A1c MFr Bld 08/01/2021 5.0  4.8 -  5.6 % Final   Comment: (NOTE) Pre diabetes:          5.7%-6.4%  Diabetes:              >6.4%  Glycemic control for   <7.0% adults with diabetes    Mean Plasma Glucose 08/01/2021 96.8  mg/dL Final   Performed at [facility], [address], [place], [place] [address]   Magnesium 08/01/2021 2.0  1.7 - 2.4 mg/dL Final   Performed at [facility], [address] [place], [place] [address]   Alcohol, Ethyl (B) 08/01/2021 <10  <10 mg/dL Final   Comment: (NOTE) Lowest detectable limit for serum alcohol is 10 mg/dL.  For medical purposes only. Performed at [facility], [address] [place], [place] [address]    Cholesterol 08/01/2021 199  0 - 200 mg/dL Final   Triglycerides 21/30/8657 39  <150 mg/dL Final   HDL 84/69/6295 123  >40 mg/dL Final   Total CHOL/HDL Ratio 08/01/2021 1.6  RATIO Final   VLDL 08/01/2021 8  0 - 40 mg/dL Final   LDL Cholesterol  08/01/2021 68  0 - 99 mg/dL Final   Comment:        Total Cholesterol/HDL:CHD Risk Coronary Heart Disease Risk Table                     Men   Women  1/2 Average Risk   3.4   3.3  Average Risk       5.0   4.4  2 X Average Risk   9.6   7.1  3 X Average Risk  23.4   11.0        Use the calculated Patient Ratio above and the CHD Risk Table to determine the patient's CHD Risk.        ATP III CLASSIFICATION (LDL):  <100     mg/dL   Optimal  284-132  mg/dL   Near or Above                    Optimal  130-159  mg/dL   Borderline  440-102  mg/dL   High  >725     mg/dL   Very High Performed at [facility], [address], [place], [place] [address]    TSH 08/01/2021 1.457  0.350 - 4.500 uIU/mL Final   Comment: Performed by a 3rd Generation assay with a functional sensitivity of <=0.01 uIU/mL. Performed at [facility], [address], [place], [place] [address]    Lithium Lvl 08/01/2021 0.52 (L)  0.60 - 1.20 mmol/L Final   Performed at [facility], [address] [place], [place] [address]   SARS Coronavirus 2 Ag 08/01/2021 Negative  Negative Preliminary   SARSCOV2ONAVIRUS 2 AG 08/01/2021 NEGATIVE  NEGATIVE Final   Comment: (NOTE) SARS-CoV-2 antigen NOT DETECTED.   Negative results are presumptive.  Negative results do not preclude SARS-CoV-2 infection and should not be used as the sole basis for treatment or other patient management decisions, including infection  control decisions, particularly in the presence of clinical signs and  symptoms consistent with COVID-19, or in those who have been in contact with the virus.  Negative results must be combined with clinical observations, patient history, and epidemiological information. The expected result is Negative.  Fact Sheet for Patients: https://www.jennings-kim.com/  Fact Sheet for Healthcare Providers: https://alexander-rogers.biz/  This test is not yet approved or cleared  by the  [place] and  has been authorized for detection and/or diagnosis of SARS-CoV-2 by FDA under an Emergency Use Authorization (EUA).  This EUA will remain in effect (meaning this test can be used) for the duration of  the COV                          ID-19 declaration under Section 564(b)(1) of the Act, 21 U.S.C. section 360bbb-3(b)(1), unless the authorization is terminated or revoked sooner.      Allergies: Patient has no known allergies.  PTA Medications: (Not in a hospital admission)   Medical Decision Making  Patient is a 20 year old female with past psychiatric and medical history as stated above, who presents back to [facility] after being evaluated in the ED for cellulitis of right hip area after initially being evaluated at the [facility] last night on 08/01/2021, in which patient presented under IVC and was recommended for inpatient psychiatric treatment at that time.  Patient will be admitted to continuous assessment while waiting for placement for inpatient psychiatric treatment.    Recommendations  Based on my evaluation the patient does not appear to have an emergency medical condition.  Patient was medically cleared in the emergency department.  Patient will be admitted to [facility] continuous assessment for further stabilization and treatment while waiting for placement for inpatient psychiatric treatment.  08/01/2021 labs/tests reviewed:  -PCR Flu A&B, COVID: Negative  -CBC with differential: Leukocytosis noted with white blood cell count of 13.1 K/uL, absolute neutrophil count elevated at 9.8 K/uL, monocyte count elevated at 1.4 K/uL.  These elevations are likely secondary to patient's cellulitis.  CBC otherwise unremarkable.  -CMP: Total protein slightly elevated at 8.3 g/dL.  Do not suspect that this lab value is indicative of an emergent process at this time.  CMP otherwise unremarkable.  -Magnesium: Within normal limits at 2.0 mg/dL  -Ethanol: Within normal limits/less than  10 mg/dL  -Lipid panel: Within normal limits  -TSH: Within normal limits at 1.457 uIu/mL  -Prolactin: Results pending  -Lithium level: Decreased/subtherapeutic at 0.52 mmol/L  -Hemoglobin A1c: Within normal limits at 5.0%  -I-STAT beta hCG blood: 5.0 mIU/mL   -Based on this lab value, do not suspect that patient is pregnant or needs a urine pregnancy test at this time.  -EKG: Sinus tachycardia with ventricular rate of 103 bpm but no acute/concerning findings or signs of ischemia with QT/QTC of 364/476.  QT/QTC and other lab work is appropriate for continuation of antipsychotic medications at this time.  UDS ordered: Patient to provide urine specimen during the day on 08/02/2021  Will continue the following medications that were initiated for the patient at the [facility] on 08/01/2021 prior to patient being transferred to the ED:  -Zyprexa 5 mg p.o. twice daily for bipolar disorder/mood  -Vistaril 25 mg p.o. 3 times daily as needed for anxiety  -Trazodone 50 mg p.o. at bedtime as needed for sleep  We will continue the following medications that were initiated in the emergency department for the patient's cellulitis:  -Keflex 500 mg p.o. every 12 hours for 7 days/13 doses for cellulitis of the right hip/lower extremity  -Bacitracin ointment: Topical twice daily-instructions placed in order to apply this to the wounds after wounds have been cleaned and repaired.  Care order placed for this as well.  Agitation protocol ordered:  -Zyprexa Zydis disintegrating tablet 5 mg p.o. every 8 hours as needed for agitation  Or  -Ativan 1  mg p.o. as needed for anxiety/severe agitation (one-time dose)   Or  -Geodon 20 mg p.o. IM as needed for agitation (one-time dose)  -Tylenol 650 mg p.o. every 6 hours as needed order for mild pain -Maalox/Mylanta 30 mL p.o. every 4 hours as needed ordered for indigestion -Milk of Magnesia 30 mL p.o. daily as needed ordered for mild constipation  Jaclyn Shaggy,  PA-C 08/02/21  6:04 AM

## 2021-08-02 NOTE — Tx Team (Signed)
Initial Treatment Plan 08/02/2021 4:58 PM [name] JIR:[id number]    PATIENT STRESSORS: Educational concerns   Financial difficulties   Health problems   Loss of apartment or job   Marital or family conflict   Medication change or noncompliance     PATIENT STRENGTHS: Manufacturing systems engineer  Physical Health    PATIENT IDENTIFIED PROBLEMS: anxiety  depression  Right hip abcess  Family conflict               DISCHARGE CRITERIA:  Ability to meet basic life and health needs Adequate post-discharge living arrangements Improved stabilization in mood, thinking, and/or behavior Medical problems require only outpatient monitoring Motivation to continue treatment in a less acute level of care Need for constant or close observation no longer present Safe-care adequate arrangements made  PRELIMINARY DISCHARGE PLAN: Outpatient therapy Participate in family therapy Return to previous work or school arrangements  PATIENT/FAMILY INVOLVEMENT: This treatment plan has been presented to and reviewed with the patient, Kaitlyn Cline. The patient hasbeen given the opportunity to ask questions and make suggestions.  Wardell Heath, RN 08/02/2021, 4:58 PM

## 2021-08-02 NOTE — ED Provider Notes (Signed)
No subcutaneous emphysema on x-ray.  Keflex for mild cellulitis.  DC back to [facility]   Roxy Horseman, PA-C 08/02/21 0130    Cathren Laine, MD 08/02/21 [phone]

## 2021-08-02 NOTE — ED Notes (Signed)
[employer] called to assist in transporting pt to [facility].  Pt aware she is to be transported.  Will continue to monitor for safety.

## 2021-08-02 NOTE — Progress Notes (Signed)
Pt accepted to [facility] 405-1    Patient meets inpatient criteria per Doran Heater, NP   The attending provider will be Phineas Inches, MD  Call report to [phone]    Isaiah Serge, LPN @ [facility] notified.     Pt scheduled  to arrive at [facility] today at 1330.   Damita Dunnings, MSW, LCSW-A  1:30 PM 08/02/2021

## 2021-08-02 NOTE — Progress Notes (Signed)
   08/02/21 1605  Psych Admission Type (Psych Patients Only)  Admission Status Involuntary  Psychosocial Assessment  Patient Complaints Anxiety;Depression  Eye Contact Intense  Facial Expression Pensive;Wide-eyed  Affect Preoccupied  Speech Incoherent  Interaction Assertive  Appearance/Hygiene Unremarkable  Behavior Characteristics Agitated;Anxious;Fidgety;Impulsive  Mood Anxious;Suspicious;Angry;Ashamed/humiliated;Irritable;Preoccupied;Sad  Aggressive Behavior  Targets Other (Comment) (room mates)  Type of Behavior Threatening  Effect No apparent injury  Thought Process  Coherency Loose associations;Disorganized  Content Preoccupation;Paranoia  Delusions Paranoid  Perception Derealization  Hallucination None reported or observed  Judgment Poor  Confusion Moderate  Danger to Self  Current suicidal ideation? Denies  Self-Injurious Behavior No self-injurious ideation or behavior indicators observed or expressed   Agreement Not to Harm Self Yes  Description of Agreement verbal  Danger to Others  Danger to Others None reported or observed  Danger to Others Abnormal  Harmful Behavior to others No threats or harm toward other people  Destructive Behavior No threats or harm toward property   Initial Nursing Assessment  D: Patient is a 20 y.o. AA female IVC'd by roommate for SI, Bizarre and aggressive behavior. Pt. Was recently in  [facility]. Pt. Currently denies SI/HI/AVH. Pt. Was a poor historian. Pt. Had unorganized thoughts. Pt. Reported that she works at [employer] and was being bullied by a female Radio broadcast assistant.Pt  when on to talk about her family was jealous of her success. Pt. Stated that she was at work and started acting wild like an Educational psychologist, and the police came and pounced on her and cuffed her and took her to [facility] According to the nurse to nurse report once pt. Came back to her apartment, pt got into an altercation, with roommate and stated that she  wanted to kill herself, and she was fighting with the roommate. Roommate IVC'd her. Pt. Has a abscess on her right hip that is covered with gauze and tape. Pt. Reported that she thinks it came from taking her medicine, lithium or Remeron. Pt. Reported that she stopped taking her medicine because it was too strong and she didn't like how it made her feel. A:  Support and encouragement provided Routine safety checks conducted every 15 minutes. Patient  Informed to notify staff with any concerns.   R:  Patient  verbally contract for safety. Safety maintained.

## 2021-08-02 NOTE — ED Notes (Signed)
[employer] present to take pt to [facility]. Paperwork and belongings given to [employer] prior to transport.

## 2021-08-02 NOTE — ED Notes (Signed)
Transport set up.

## 2021-08-02 NOTE — BH Assessment (Signed)
Pt's roommate asked questions about the pt's disposition. Clinician expressed she does not have the pt's consent to share any information. Clinician expressed she can call to receive information because she IVC'd the pt but is unable to provide any information without consent.   Pt's roommate asked clinician to take numbers of family, if the pt consents can someone give them an update on pt's care/disposition. Crystal (cousin), [phone], Timothy Lasso, [phone] (roommate/IVC petitioner), Zack Seal (cousin in [place]) [phone].   Redmond Pulling, MS, [facility], [facility] Triage Specialist [phone]

## 2021-08-02 NOTE — ED Notes (Addendum)
Pt returns to [facility], pt under IVC by room-mate, for aggressive behavior, SI and paranoia.  Pt eval at [facility] ED for redness and swelling to injection site at rt hip. Pt started on Keflex at ED.  Pt calm & cooperative, no distress noted.  Monitoring for safety.

## 2021-08-02 NOTE — ED Notes (Signed)
Pt resting quietly in recliner.  Took all morning medications.  Denies SI, HI,  and AVH.  Pt preoccupied with making sure she gets her medications,  she has made frequent trips to the nursing window to request when her medications will be due.    Pt also reports she does not want her school, work, or roommates to know where she is at this time.  Wound to right hip cleaned and dressed.  Area is warm to touch and swollen.  Pain reported to right hip area where wound is present.  Pt states pain comes and goes when her clothes rub against it.  Padded gauze placed over area to provide a barrier.  Breathing is even and unlabored.  Will continue to monitor for safety.

## 2021-08-02 NOTE — ED Notes (Signed)
Meal given

## 2021-08-02 NOTE — ED Notes (Signed)
Patient

## 2021-08-03 ENCOUNTER — Encounter (HOSPITAL_COMMUNITY): Payer: Self-pay

## 2021-08-03 LAB — PROLACTIN: Prolactin: 8.6 ng/mL (ref 4.8–23.3)

## 2021-08-03 MED ORDER — DIPHENHYDRAMINE HCL 50 MG/ML IJ SOLN: 50.0000 mg | Freq: Four times a day (QID) | INTRAMUSCULAR | Status: DC | PRN

## 2021-08-03 MED ORDER — HALOPERIDOL 5 MG PO TABS: 5.0000 mg | ORAL_TABLET | Freq: Four times a day (QID) | ORAL | Status: DC | PRN

## 2021-08-03 MED ORDER — BENZTROPINE MESYLATE 1 MG PO TABS: 1.0000 mg | ORAL_TABLET | Freq: Four times a day (QID) | ORAL | Status: DC | PRN

## 2021-08-03 MED ORDER — RISPERIDONE 1 MG PO TABS: 1.5000 mg | ORAL_TABLET | Freq: Two times a day (BID) | ORAL | Status: DC

## 2021-08-03 MED ORDER — LITHIUM CARBONATE ER 300 MG PO TBCR: 300.0000 mg | EXTENDED_RELEASE_TABLET | Freq: Two times a day (BID) | ORAL | Status: DC

## 2021-08-03 MED ORDER — HALOPERIDOL 5 MG PO TABS
5.0000 mg | ORAL_TABLET | Freq: Two times a day (BID) | ORAL | Status: DC
  Administered 2021-08-03 – 2021-08-04 (×2): 5 mg via ORAL
  Filled 2021-08-03 (×7): qty 1

## 2021-08-03 MED ORDER — LORAZEPAM 2 MG/ML IJ SOLN: 2.0000 mg | Freq: Four times a day (QID) | INTRAMUSCULAR | Status: DC | PRN

## 2021-08-03 MED ORDER — HALOPERIDOL 5 MG PO TABS: 5.0000 mg | ORAL_TABLET | Freq: Two times a day (BID) | ORAL | Status: DC

## 2021-08-03 MED ORDER — QUETIAPINE FUMARATE 50 MG PO TABS: 50.0000 mg | ORAL_TABLET | Freq: Every day | ORAL | Status: DC

## 2021-08-03 MED ORDER — HALOPERIDOL LACTATE 5 MG/ML IJ SOLN: 5.0000 mg | Freq: Four times a day (QID) | INTRAMUSCULAR | Status: DC | PRN

## 2021-08-03 NOTE — BHH Suicide Risk Assessment (Addendum)
[facility] Admission Suicide Risk Assessment   Nursing information obtained from:  Patient Demographic factors:  Adolescent or young adult, Gay, lesbian, or bisexual orientation, Low socioeconomic status Current Mental Status:  Suicidal ideation indicated by others Loss Factors:  Financial problems / change in socioeconomic status Historical Factors:  Family history of mental illness or substance abuse Risk Reduction Factors:  Employed  Total Time spent with patient: 1 hour Principal Problem: Bipolar 1 disorder, depressed ([place]) Diagnosis:  Principal Problem:   Bipolar 1 disorder, depressed ([place])   Subjective Data:  Patient is a 20 year old female with history of bipolar disorder and cannabis use disorder involuntarily presenting to [facility] from behavioral health urgent care due to per roommate " multiple patient states she will kill her self.  Respondent also threatened to blow up the home."  On assessment this afternoon, patient reports that she is here because she had a "fight or flight" moment.  Patient does not go into much details regarding this.  Patient is very disorganized and equally tangential.  Patient reports "looking for my purpose" as well as she is "running from my family".  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  Patient reports she does not carry any medical or psychiatric diagnoses.  Patient reports her current mood to be "stagnant".  Patient denies suicidal ideations.  Patient denies homicidal ideation by stating "I love babies so no".  Patient states that she is "talking in science" when she refers to her medications and her psychiatric diagnoses.  Patient reports concern for her abrasion that is located her right hip.  Patient reports that there is likely due to "the lithium being too strong".  Patient reports that she can sleep and she has been eating well. Patient does not  think she has any mood instability. Patient does report "I may have Bipolar Disorder but I am not sure". Patient denies feeling depressed or having any depression symptoms including: Depressed mood, anhedonia, change in appetite, change in sleep pattern, changes in concentration/energy level. Patient denies ever having a manic/hypomanic episode but appears to be in one currently. Patient denies symptoms of psychosis including paranoia, auditory/visual hallucinations, ideas of reference.    Continued Clinical Symptoms:    The "Alcohol Use Disorders Identification Test", Guidelines for Use in Primary Care, Second Edition.  World Pharmacologist [facility]). Score between 0-7:  no or low risk or alcohol related problems. Score between 8-15:  moderate risk of alcohol related problems. Score between 16-19:  high risk of alcohol related problems. Score 20 or above:  warrants further diagnostic evaluation for alcohol dependence and treatment.   CLINICAL FACTORS:   Bipolar Disorder:   Bipolar II Previous Psychiatric Diagnoses and Treatments   Musculoskeletal: Strength & Muscle Tone: within normal limits Gait & Station: normal Patient leans: N/A  Psychiatric Specialty Exam:  Presentation  General Appearance: Appropriate for Environment; Casual   Eye Contact:Good   Speech:Clear and Coherent   Speech Volume:Normal   Handedness:Right   Mood and Affect  Mood:Anxious; Depressed   Affect:Constricted    Thought Process  Thought Processes:Coherent; Goal Directed   Descriptions of Associations:Tangential   Orientation:Full (Time, Place and Person)   Thought Content:Illogical; Scattered   History of Schizophrenia/Schizoaffective disorder:No data recorded  Duration of Psychotic Symptoms:No data recorded  Hallucinations:Hallucinations: None   Ideas of Reference:None   Suicidal Thoughts:Suicidal Thoughts: No   Homicidal Thoughts:Homicidal Thoughts:  No  Sensorium  Memory:Immediate Good; Recent Fair; Remote Fair   Judgment:Fair   Insight:Shallow    Executive Functions  Concentration:Poor   Attention Span:Poor   Recall:Good   Fund of Knowledge:Good   Language:Good    Psychomotor Activity  Psychomotor Activity:Psychomotor Activity: Normal    Assets  Assets:Communication Skills; Desire for Improvement; Financial Resources/Insurance; Housing    Sleep  Sleep:Sleep: Fair     Physical Exam: Physical Exam Vitals and nursing note reviewed.  Constitutional:      Appearance: Normal appearance. She is normal weight.  HENT:     Head: Normocephalic and atraumatic.  Pulmonary:     Effort: Pulmonary effort is normal.  Skin:    Findings: Lesion present.     Comments: Lesion is erythematous surrounding small central wound.  Painful upon palpation and slight warmth.  Covered with dry wound dressing  Neurological:     General: No focal deficit present.     Mental Status: She is oriented to person, place, and time.   Review of Systems  Respiratory:  Negative for shortness of breath.   Cardiovascular:  Negative for chest pain.  Gastrointestinal:  Negative for abdominal pain, constipation, diarrhea, heartburn, nausea and vomiting.  Neurological:  Negative for headaches.  Blood pressure 125/83, pulse (!) 120, temperature 98.8 F (37.1 C), resp. rate 18, height 5\' 2"  (1.575 m), weight 56.7 kg, SpO2 100 %. Body mass index is 22.86 kg/m.   COGNITIVE FEATURES THAT CONTRIBUTE TO RISK:  Thought constriction (tunnel vision)    SUICIDE RISK:   Mild:  Suicidal ideation of limited frequency, intensity, duration, and specificity.  There are no identifiable plans, no associated intent, mild dysphoria and related symptoms, good self-control (both objective and subjective assessment), few other risk factors, and identifiable protective factors, including available and accessible social support.  PLAN OF CARE: see  H&P  I certify that inpatient services furnished can reasonably be expected to improve the patient's condition.   , MD 08/03/2021, 6:25 PM   Total Time Spent in Direct Patient Care:  I personally spent 60 minutes on the unit in direct patient care. The direct patient care time included face-to-face time with the patient, reviewing the patient's chart, communicating with other professionals, and coordinating care. Greater than 50% of this time was spent in counseling or coordinating care with the patient regarding goals of hospitalization, psycho-education, and discharge planning needs.  I have independently evaluated the patient during a face-to-face assessment on 08/03/21. I reviewed the patient's chart, and I participated in key portions of the service. I discussed the case with the 14/09/22, and I agree with the assessment and plan of care as documented in the House Officer's note, as addended by me or notated below:  Pt is disorganized. Bipolar type 1 by history with disorganized and bizarre thoughts.  Agree with plan to hold home meds, including lithium, while we confirm if pt is pregnant or not. Start haldol for now as this is more safe in treating mania /psychosis in pt who could be pregnant. Pt agreeable with holding lithium and starting haldol.  Keflex for hip wound.   [employer], MD Psychiatrist

## 2021-08-03 NOTE — BHH Suicide Risk Assessment (Signed)
BHH INPATIENT:  Family/Significant Other Suicide Prevention Education  Suicide Prevention Education:  Patient Refusal for Family/Significant Other Suicide Prevention Education: The patient Kaitlyn Cline has refused to provide written consent for family/significant other to be provided Family/Significant Other Suicide Prevention Education during admission and/or prior to discharge.  Physician notified.  [place] Kung Tenaya Hilyer 08/03/2021, 12:18 PM

## 2021-08-03 NOTE — Progress Notes (Signed)
Patient did not attend the evening speaker AA meeting.  

## 2021-08-03 NOTE — BHH Counselor (Signed)
CSW spoke with the Pt who states that she wanted more information about how to apply for a 50B and a 50C and where to apply for those orders.  CSW provided the Pt with information on how and where to apply for those orders.  The Pt states that there is an individual at work that has been harassing her and she does not want to be near that individual any more.  She states that she does not want to get the person fired but she does not want to work near them any longer.  CSW urged the Pt to go to the [place] of Court to apply for a 50C and take it to her employer after discharge to see what could be done about the matter.  CSW explained to the Pt that this was something that would need to be worked on after her discharge from the hospital and that the court and the Pt's HR at their place of employment could help them further.

## 2021-08-03 NOTE — Progress Notes (Signed)
Pt in stable mood. Pt resting in bed majority of the shift. Easy to arouse when waking up for meds. Pt states she just feels tired. She also verbalized still feeling anxious, sad, and depressed. Minimal interaction with staff and peers. Denies SI/HI. Denies AVH.     08/02/21 2030  Psych Admission Type (Psych Patients Only)  Admission Status Involuntary  Psychosocial Assessment  Patient Complaints Anxiety;Depression  Eye Contact Avoids  Facial Expression Sad  Affect Anxious;Depressed;Sad  Speech Soft  Interaction Minimal  Motor Activity Slow  Appearance/Hygiene Unremarkable  Behavior Characteristics Anxious;Fidgety  Mood Anxious;Sad;Depressed  Thought Process  Coherency Circumstantial  Content Blaming others  Delusions None reported or observed  Perception WDL  Hallucination None reported or observed  Judgment Poor  Confusion WDL  Danger to Self  Current suicidal ideation? Denies  Danger to Others  Danger to Others None reported or observed  Danger to Others Abnormal  Harmful Behavior to others No threats or harm toward other people

## 2021-08-03 NOTE — BH IP Treatment Plan (Signed)
Interdisciplinary Treatment and Diagnostic Plan Update  08/03/2021 Kaitlyn Cline MRN: [id number]  Principal Diagnosis: Bipolar 1 disorder, depressed ([place])  Secondary Diagnoses: Principal Problem:   Bipolar 1 disorder, depressed ([place])   Current Medications:  Current Facility-Administered Medications  Medication Dose Route Frequency Provider Last Rate Last Admin   acetaminophen (TYLENOL) tablet 650 mg  650 mg Oral Q6H PRN Lucky Rathke, FNP       alum & mag hydroxide-simeth (MAALOX/MYLANTA) 200-200-20 MG/5ML suspension 30 mL  30 mL Oral Q4H PRN Lucky Rathke, FNP       cephALEXin (KEFLEX) capsule 500 mg  500 mg Oral BID Lucky Rathke, FNP   500 mg at 08/03/21 6837   hydrOXYzine (ATARAX) tablet 25 mg  25 mg Oral TID PRN Lucky Rathke, FNP       OLANZapine zydis (ZYPREXA) disintegrating tablet 5 mg  5 mg Oral Q8H PRN Lucky Rathke, FNP       And   LORazepam (ATIVAN) tablet 1 mg  1 mg Oral PRN Lucky Rathke, FNP       And   ziprasidone (GEODON) injection 20 mg  20 mg Intramuscular PRN Lucky Rathke, FNP       magnesium hydroxide (MILK OF MAGNESIA) suspension 30 mL  30 mL Oral Daily PRN Lucky Rathke, FNP       OLANZapine zydis (ZYPREXA) disintegrating tablet 5 mg  5 mg Oral BID Lucky Rathke, FNP   5 mg at 08/03/21 0806   traZODone (DESYREL) tablet 50 mg  50 mg Oral QHS PRN Lucky Rathke, FNP       PTA Medications: Medications Prior to Admission  Medication Sig Dispense Refill Last Dose   cephALEXin (KEFLEX) 500 MG capsule Take 1 capsule (500 mg total) by mouth 2 (two) times daily. 14 capsule 0    lithium carbonate (LITHOBID) 300 MG CR tablet Take 300 mg by mouth in the morning and at bedtime.      mirtazapine (REMERON) 15 MG tablet Take 15 mg by mouth at bedtime.       Patient Stressors: Educational concerns   Financial difficulties   Health problems   Loss of apartment or job   Marital or family conflict   Medication change or noncompliance    Patient Strengths: Music therapist  Physical Health   Treatment Modalities: Medication Management, Group therapy, Case management,  1 to 1 session with clinician, Psychoeducation, Recreational therapy.   Physician Treatment Plan for Primary Diagnosis: Bipolar 1 disorder, depressed ([place]) Long Term Goal(s):     Short Term Goals:    Medication Management: Evaluate patient's response, side effects, and tolerance of medication regimen.  Therapeutic Interventions: 1 to 1 sessions, Unit Group sessions and Medication administration.  Evaluation of Outcomes: Not Met  Physician Treatment Plan for Secondary Diagnosis: Principal Problem:   Bipolar 1 disorder, depressed ([place])  Long Term Goal(s):     Short Term Goals:       Medication Management: Evaluate patient's response, side effects, and tolerance of medication regimen.  Therapeutic Interventions: 1 to 1 sessions, Unit Group sessions and Medication administration.  Evaluation of Outcomes: Not Met   RN Treatment Plan for Primary Diagnosis: Bipolar 1 disorder, depressed ([place]) Long Term Goal(s): Knowledge of disease and therapeutic regimen to maintain health will improve  Short Term Goals: Ability to demonstrate self-control, Ability to participate in decision making will improve, and Ability to verbalize feelings will improve  Medication Management: RN will administer  medications as ordered by provider, will assess and evaluate patient's response and provide education to patient for prescribed medication. RN will report any adverse and/or side effects to prescribing provider.  Therapeutic Interventions: 1 on 1 counseling sessions, Psychoeducation, Medication administration, Evaluate responses to treatment, Monitor vital signs and CBGs as ordered, Perform/monitor CIWA, COWS, AIMS and Fall Risk screenings as ordered, Perform wound care treatments as ordered.  Evaluation of Outcomes: Not Met   LCSW Treatment Plan for Primary Diagnosis: Bipolar 1 disorder,  depressed ([place]) Long Term Goal(s): Safe transition to appropriate next level of care at discharge, Engage patient in therapeutic group addressing interpersonal concerns.  Short Term Goals: Engage patient in aftercare planning with referrals and resources, Increase social support, and Increase ability to appropriately verbalize feelings  Therapeutic Interventions: Assess for all discharge needs, 1 to 1 time with Social worker, Explore available resources and support systems, Assess for adequacy in community support network, Educate family and significant other(s) on suicide prevention, Complete Psychosocial Assessment, Interpersonal group therapy.  Evaluation of Outcomes: Not Met   Progress in Treatment: Attending groups: Yes. Participating in groups: Yes. Taking medication as prescribed: Yes. Toleration medication: Yes. Family/Significant other contact made: Yes, individual(s) contacted:  pt declined consents Patient understands diagnosis: Yes. Discussing patient identified problems/goals with staff: Yes. Medical problems stabilized or resolved: Yes. Denies suicidal/homicidal ideation: Yes. Issues/concerns per patient self-inventory: No. Other: None  New problem(s) identified: No, Describe:  None  New Short Term/Long Term Goal(s):medication stabilization, elimination of SI thoughts, development of comprehensive mental wellness plan.   Patient Goals: "self growth"  Discharge Plan or Barriers: Patient recently admitted. CSW will continue to follow and assess for appropriate referrals and possible discharge planning.   Reason for Continuation of Hospitalization: Depression Mania Medication stabilization  Estimated Length of Stay: 3-5 days   Scribe for Treatment Team: Eliott Nine 08/03/2021 12:40 PM
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
## 2021-08-03 NOTE — Plan of Care (Signed)
Patient has stayed in the milieu and attended some groups. Alert and oriented x4. Experiencing anxiety and restlessness but denying SI/HI. Denied hallucinations.

## 2021-08-03 NOTE — Progress Notes (Addendum)
  Pt presents with high anxiety.  Pt reports, I need with getting my bills paid."  Pt reports, "I need to speak with a social worker to call and get an extension on my rent."  Pt reports, "I need help getting a 50-B processed, on my job."  Pt denies SI/HI and verbally contracts for safety.  Pt denies AVH.  Pt declines all vaccines.    Vitals recorded.  Pt's HR elevated.  Scheduled Medication administered.  Pt remains safe on unit with Q 15 minute safety checks.  Will continue to monitor.     08/03/21 2130  Psych Admission Type (Psych Patients Only)  Admission Status Involuntary  Psychosocial Assessment  Patient Complaints Anxiety;Worrying  Eye Contact Intense  Facial Expression Anxious;Worried  Affect Anxious  Surveyor, minerals Activity Restless  Appearance/Hygiene Designer, industrial/product Cooperative;Anxious  Mood Anxious;Suspicious  Thought Process  Coherency Circumstantial  Content Blaming others  Delusions None reported or observed  Perception WDL  Hallucination None reported or observed  Judgment Poor  Confusion WDL  Danger to Self  Current suicidal ideation? Denies  Self-Injurious Behavior No self-injurious ideation or behavior indicators observed or expressed   Agreement Not to Harm Self Yes  Description of Agreement verbal contract for safety  Danger to Others  Danger to Others None reported or observed  Danger to Others Abnormal  Harmful Behavior to others No threats or harm toward other people  Destructive Behavior No threats or harm toward property

## 2021-08-03 NOTE — Plan of Care (Signed)
?  Problem: Education: ?Goal: Ability to state activities that reduce stress will improve ?Outcome: Not Progressing ?  ?Problem: Coping: ?Goal: Ability to identify and develop effective coping behavior will improve ?Outcome: Not Progressing ?  ?Problem: Self-Concept: ?Goal: Ability to identify factors that promote anxiety will improve ?Outcome: Not Progressing ?  ?

## 2021-08-03 NOTE — Group Note (Signed)
Recreation Therapy Group Note   Group Topic:Stress Management  Group Date: 08/03/2021 Start Time: 0930 End Time: 0949 Facilitators: Caroll Rancher, [place] Location: [address] Dayroom  Goal Area(s) Addresses:  Patient will identify positive stress management techniques. Patient will identify benefits of using stress management post d/c.  Group Description:  Meditation.  LRT played a meditation that focused on setting boundaries with the people around you.  Meditation emphasized the point of saying no to things and the wishes of others is ok in order to put your feelings and desires first.      Affect/Mood: Appropriate   Participation Level: Active   Participation Quality: Independent   Behavior: Appropriate   Speech/Thought Process: Focused   Insight: Good   Judgement: Good   Modes of Intervention: Meditation   Patient Response to Interventions:  Engaged   Education Outcome:  Acknowledges education and In group clarification offered    Clinical Observations/Individualized Feedback: Pt attended and participated in group.    Plan: Continue to engage patient in RT group sessions 2-3x/week.   Caroll Rancher, LRT,CTRS 08/03/2021 11:15 AM

## 2021-08-03 NOTE — Group Note (Signed)
LCSW Group Therapy Note   Group Date: 08/03/2021 Start Time: 1300 End Time: 1400  Topic: Coping Skills  Due to the acuity and a high number of new admissions, group was not held. Patient was provided therapeutic worksheets and asked to meet with CSW as needed.   Felizardo Hoffmann, LCSWA 08/03/2021  12:57 PM

## 2021-08-04 DIAGNOSIS — F312 Bipolar disorder, current episode manic severe with psychotic features: Secondary | ICD-10-CM

## 2021-08-04 DIAGNOSIS — F3110 Bipolar disorder, current episode manic without psychotic features, unspecified: Secondary | ICD-10-CM

## 2021-08-04 LAB — HCG, QUANTITATIVE, PREGNANCY
hCG, Beta Chain, Quant, S: 1 m[IU]/mL (ref <5)
hCG, Beta Chain, Quant, S: <1 m[IU]/mL (ref <5)

## 2021-08-04 LAB — CBC WITH DIFFERENTIAL/PLATELET
Abs Immature Granulocytes: 0.01 10*3/uL (ref 0.00–0.07)
Basophils Absolute: 0.1 10*3/uL (ref 0.0–0.1)
Basophils Relative: 1 %
Eosinophils Absolute: 0.3 10*3/uL (ref 0.0–0.5)
Eosinophils Relative: 3 %
HCT: 39.1 % (ref 36.0–46.0)
Hemoglobin: 12.4 g/dL (ref 12.0–15.0)
Immature Granulocytes: 0 %
Lymphocytes Relative: 44 %
Lymphs Abs: 4 10*3/uL (ref 0.7–4.0)
MCH: 30.5 pg (ref 26.0–34.0)
MCHC: 31.7 g/dL (ref 30.0–36.0)
MCV: 96.1 fL (ref 80.0–100.0)
Monocytes Absolute: 0.9 10*3/uL (ref 0.1–1.0)
Monocytes Relative: 10 %
Neutro Abs: 3.7 10*3/uL (ref 1.7–7.7)
Neutrophils Relative %: 42 %
Platelets: 320 10*3/uL (ref 150–400)
RBC: 4.07 MIL/uL (ref 3.87–5.11)
RDW: 13.1 % (ref 11.5–15.5)
WBC: 8.9 10*3/uL (ref 4.0–10.5)
nRBC: 0 % (ref 0.0–0.2)

## 2021-08-04 LAB — PREGNANCY, URINE: Preg Test, Ur: NEGATIVE

## 2021-08-04 MED ORDER — BENZTROPINE MESYLATE 0.5 MG PO TABS
0.5000 mg | ORAL_TABLET | Freq: Two times a day (BID) | ORAL | Status: DC
  Administered 2021-08-04 – 2021-08-08 (×8): 0.5 mg via ORAL
  Filled 2021-08-04 (×11): qty 1

## 2021-08-04 MED ORDER — PROPRANOLOL HCL 10 MG PO TABS
10.0000 mg | ORAL_TABLET | Freq: Two times a day (BID) | ORAL | Status: DC
  Administered 2021-08-04 – 2021-08-08 (×8): 10 mg via ORAL
  Filled 2021-08-04 (×11): qty 1

## 2021-08-04 MED ORDER — WHITE PETROLATUM EX OINT
TOPICAL_OINTMENT | CUTANEOUS | Status: AC
  Administered 2021-08-04: 1
  Filled 2021-08-04: qty 5

## 2021-08-04 MED ORDER — HALOPERIDOL 5 MG PO TABS
5.0000 mg | ORAL_TABLET | Freq: Three times a day (TID) | ORAL | Status: DC
  Administered 2021-08-04 – 2021-08-06 (×5): 5 mg via ORAL
  Filled 2021-08-04 (×8): qty 1

## 2021-08-04 NOTE — Progress Notes (Signed)
   Received pt's completed suicide safety plan.    In pt's responses, pt repeatedly  states, "I do not have suicidal thoughts, I am not suicidal, Calling 911 is unnecessary.  Pt further states, "To remain positive during her stay, I am requesting more visits with psychiatrist/therapist and more visits with a Child psychotherapist.  Pt did not feel comfortable with providing telephone numbers and contact people she could call if she were to become suicidal.

## 2021-08-04 NOTE — Progress Notes (Signed)
Psychoeducational Group Note  Date:  08/04/2021 Time:  2223  Group Topic/Focus:  Wrap-Up Group:   The focus of this group is to help patients review their daily goal of treatment and discuss progress on daily workbooks.  Participation Level: Did Not Attend  Participation Quality:  Not Applicable  Affect:  Not Applicable  Cognitive:  Not Applicable  Insight:  Not Applicable  Engagement in Group: Not Applicable  Additional Comments:  The patient did not attend group this evening.   Hazle Coca S 08/04/2021, 10:23 PM

## 2021-08-04 NOTE — Progress Notes (Signed)
Patient did not attend goals group.  

## 2021-08-04 NOTE — [facility] Group Notes (Signed)
.  Psychoeducational Group Note    Date:08/04/2021 Time: 1300-1400    Purpose of Group: . The group focus' on teaching patients on how to identify their needs and their Life Skills:  A group where two lists are made. What people need and what are things that we do that are unhealthy. The lists are developed by the patients and it is explained that we often do the actions that are not healthy to get our list of needs met.  Goal:: to develop the coping skills needed to get their needs met  Participation Level:  Active  Participation Quality:  Appropriate  Affect:  Appropriate  Cognitive:  Oriented  Insight:  Improving  Engagement in Group:  Engaged  Additional Comments:  .Pt  rates her energy at an 8/10. Interrupted other patients in the middle of talking. Asked gently and with respect to please not interrupt others. Was then able to give concrete feedback.  Kaitlyn Cline

## 2021-08-04 NOTE — Progress Notes (Addendum)
   08/04/21 1100  Psych Admission Type (Psych Patients Only)  Admission Status Involuntary  Psychosocial Assessment  Patient Complaints Anxiety  Eye Contact Brief  Facial Expression Anxious  Affect Anxious  Speech Logical/coherent  Interaction Assertive  Motor Activity Restless  Appearance/Hygiene Disheveled  Behavior Characteristics Cooperative;Anxious  Mood Anxious;Pleasant  Aggressive Behavior  Targets Other (Comment) (room mates)  Type of Behavior Threatening  Effect No apparent injury  Thought Process  Coherency Circumstantial  Content Blaming others  Delusions None reported or observed  Perception WDL  Hallucination None reported or observed  Judgment Poor  Confusion WDL  Danger to Self  Current suicidal ideation? Denies  Self-Injurious Behavior No self-injurious ideation or behavior indicators observed or expressed   Agreement Not to Harm Self Yes  Description of Agreement verbal contract for safety  Danger to Others  Danger to Others None reported or observed  Danger to Others Abnormal  Harmful Behavior to others No threats or harm toward other people  Destructive Behavior No threats or harm toward property    D. Pt presents with an anxious affect/ mood- - pt rated her anxiety a 5/10 on self inventory. Pt stated that her goal today was "to allow time for my physical body to reset while body adjusts to the medications". Pt observed attending group this afternoon, but left early with complaints of  "extreme grogginess".  Pt currently denies SI/HI and AVH A. Labs and vitals monitored. Pt compliant with medications. Pt supported emotionally and encouraged to express concerns and ask questions.   R. Pt remains safe with 15 minute checks. Will continue POC.

## 2021-08-04 NOTE — [facility] Group Notes (Signed)
Goals Group 08/04/21    Group Focus: affirmation, clarity of thought, and goals/reality orientation Treatment Modality:  Psychoeducation Interventions utilized were assignment, group exercise, and support Purpose: To be able to understand and verbalize the reason for their admission to the hospital. To understand that the medication helps with their chemical imbalance but they also need to work on their choices in life. To be challenged to develop a list of 30 positives about themselves. Also introduce the concept that "feelings" are not reality.  Participation Level:  Active  Participation Quality:  Appropriate  Affect:  Appropriate  Cognitive:  Appropriate  Insight:  Improving  Engagement in Group:  Engaged  Additional Comments:  Attended the group. Asked a lot of questions and was a bit intrusive in the group. Rates her energy at a 5/10  Kaitlyn Cline A

## 2021-08-04 NOTE — Progress Notes (Addendum)
[facility] MD Progress Note  08/04/2021 2:49 PM Kaitlyn Cline  MRN:  [id number]  History of Present Illness:  Patient is a 20 year old female with history of bipolar disorder and cannabis use disorder, who was taken the [facility]) by the police department for making threats to fight her roommates, "blow up the home", hostile & aggressive behaviors, and for making threats to kill herself. Pt was involuntary committed by one of her roommates, and pt was transported to this [facility] for treatment and stabilization of her mood.   Recommendations by the Psychiatry team yesterday -Holding Lithium due concern of pregnancy (Quantitative Hcg 5) -Lithium Level 0.54 (subtherapeutic) -START haloperidol 5 mg bid until patient's repeat HCG returns -IF patient HCG is negative/stable, plan to resume lithium 300 bid and risperdal 1.5 bid and seroquel 50 qhs and STOP haloperidol -Agitation protocol: haldol, ativan, benadryl  Evaluation on unit today Pt's attention to hygiene and personal grooming is fair, pt able to maintain good eye contact through entire assessment. Speech is very rapid and pressured.  Mood is labile, affect is congruent. Thoughts are disorganized, illogical, and pt exhibits flight of ideas through entire assessment as she jumps from topic to topic; talks about wanting to rest her mind, talks about wanting to go watch movies in the day room, talks about wanting more structure in her life, then talks about wanting to catch up on all of her sleep, all in response to being asked the reason for admission. Pt is very tangential in her responses to questions, doesn't fully answer assessment questions at times, but goes on to ramble about other things.  Pt denies SI/HI/AVH, denies paranoia. Concentration is poor, and pt reports a poor sleep quality last night. Pt reports a good appetite, reports having a normal energy level. Pt's main focus is to talk to the social worker regarding  her housing and bills which need to be paid. Message given to Child psychotherapist.   Blood HCG levels and urine pregnancy tests ordered. Lithium continues to be on hold at this time until  it is determined that pt is definitely not pregnant. Will revisit this with pt's treatment team tomorrow. Case discussed with Psychiatrist, who agrees that increasing Haldol 5 mg to TID will be beneficial since pt continues to manifest some hypomania. A room mate order is also necessary since pt will be disruptive to any room mate that she might have. Will also add Cogentin 0.5 mg BID for EPS prophylaxis.  During evaluation, pt pulled down her pants to reveal a dressing to her right outer hip area. Dressing pulled back and area assessed. Area with with small scabbed area, approximately a dime size, with no drainage/edema or redness. Area with slight induration. Pt denies pain to site. Clean dressing reapplied to area.  Principal Problem: Bipolar I disorder, most recent episode (or current) manic (HCC) Diagnosis: Principal Problem:   Bipolar I disorder, most recent episode (or current) manic (HCC)  Total Time spent with patient: 20 minutes  Past Psychiatric History: Please see H & P  Past Medical History: History reviewed. No pertinent past medical history. History reviewed. No pertinent surgical history. Family History: History reviewed. No pertinent family history. Family Psychiatric  History: See H & P Social History:  Social History   Substance and Sexual Activity  Alcohol Use Yes     Social History   Substance and Sexual Activity  Drug Use Yes   Types: Marijuana    Social History   Socioeconomic  History   Marital status: Single    Spouse name: Not on file   Number of children: Not on file   Years of education: 14   Highest education level: Some college, no degree  Occupational History   Not on file  Tobacco Use   Smoking status: Never   Smokeless tobacco: Not on file  Vaping Use   Vaping  Use: Unknown  Substance and Sexual Activity   Alcohol use: Yes   Drug use: Yes    Types: Marijuana   Sexual activity: Not Currently  Other Topics Concern   Not on file  Social History Narrative   Not on file   Social Determinants of Health   Financial Resource Strain: Not on file  Food Insecurity: Not on file  Transportation Needs: Not on file  Physical Activity: Not on file  Stress: Not on file  Social Connections: Not on file   Sleep: Poor  Appetite:  Fair  Current Medications: Current Facility-Administered Medications  Medication Dose Route Frequency Provider Last Rate Last Admin   acetaminophen (TYLENOL) tablet 650 mg  650 mg Oral Q6H PRN Lenard Lance, FNP       alum & mag hydroxide-simeth (MAALOX/MYLANTA) 200-200-20 MG/5ML suspension 30 mL  30 mL Oral Q4H PRN Lenard Lance, FNP       benztropine (COGENTIN) tablet 0.5 mg  0.5 mg Oral BID Nkwenti, Doris, NP       cephALEXin (KEFLEX) capsule 500 mg  500 mg Oral BID Lenard Lance, FNP   500 mg at 08/04/21 1950   haloperidol lactate (HALDOL) injection 5 mg  5 mg Intramuscular Q6H PRN Park Pope, MD       And   LORazepam (ATIVAN) injection 2 mg  2 mg Intramuscular Q6H PRN Park Pope, MD       And   diphenhydrAMINE (BENADRYL) injection 50 mg  50 mg Intramuscular Q6H PRN Park Pope, MD       haloperidol (HALDOL) tablet 5 mg  5 mg Oral TID Starleen Blue, NP       hydrOXYzine (ATARAX) tablet 25 mg  25 mg Oral TID PRN Lenard Lance, FNP       magnesium hydroxide (MILK OF MAGNESIA) suspension 30 mL  30 mL Oral Daily PRN Lenard Lance, FNP       propranolol (INDERAL) tablet 10 mg  10 mg Oral BID Starleen Blue, NP        Lab Results:  Results for orders placed or performed during the hospital encounter of 08/02/21 (from the past 48 hour(s))  hCG, quantitative, pregnancy     Status: None   Collection Time: 08/04/21  6:33 AM  Result Value Ref Range   hCG, Beta Chain, Quant, S 1 <5 mIU/mL    Comment:          GEST. AGE       CONC.  (mIU/mL)   <=1 WEEK        5 - 50     2 WEEKS       50 - 500     3 WEEKS       100 - 10,000     4 WEEKS     1,000 - 30,000     5 WEEKS     3,500 - 115,000   6-8 WEEKS     12,000 - 270,000    12 WEEKS     15,000 - 220,000        FEMALE  AND NON-PREGNANT FEMALE:     LESS THAN 5 mIU/mL Performed at [facility], [address], [place], [place] [address]   CBC with Differential/Platelet     Status: None   Collection Time: 08/04/21  6:33 AM  Result Value Ref Range   WBC 8.9 4.0 - 10.5 K/uL   RBC 4.07 3.87 - 5.11 MIL/uL   Hemoglobin 12.4 12.0 - 15.0 g/dL   HCT 75.8 83.2 - 54.9 %   MCV 96.1 80.0 - 100.0 fL   MCH 30.5 26.0 - 34.0 pg   MCHC 31.7 30.0 - 36.0 g/dL   RDW 82.6 41.5 - 83.0 %   Platelets 320 150 - 400 K/uL   nRBC 0.0 0.0 - 0.2 %   Neutrophils Relative % 42 %   Neutro Abs 3.7 1.7 - 7.7 K/uL   Lymphocytes Relative 44 %   Lymphs Abs 4.0 0.7 - 4.0 K/uL   Monocytes Relative 10 %   Monocytes Absolute 0.9 0.1 - 1.0 K/uL   Eosinophils Relative 3 %   Eosinophils Absolute 0.3 0.0 - 0.5 K/uL   Basophils Relative 1 %   Basophils Absolute 0.1 0.0 - 0.1 K/uL   Immature Granulocytes 0 %   Abs Immature Granulocytes 0.01 0.00 - 0.07 K/uL    Comment: Performed at [facility], [address] [place], [place] [address]    Blood Alcohol level:  Lab Results  Component Value Date   ETH <10 08/01/2021    Metabolic Disorder Labs: Lab Results  Component Value Date   HGBA1C 5.0 08/01/2021   MPG 96.8 08/01/2021   Lab Results  Component Value Date   PROLACTIN 8.6 08/01/2021   Lab Results  Component Value Date   CHOL 199 08/01/2021   TRIG 39 08/01/2021   HDL 123 08/01/2021   CHOLHDL 1.6 08/01/2021   VLDL 8 08/01/2021   LDLCALC 68 08/01/2021    Physical Findings: AIMS: 0  CIWA: N/A   COWS:   N/A  Musculoskeletal: Strength & Muscle Tone: within normal limits Gait & Station: normal Patient leans: N/A  Psychiatric Specialty  Exam:  Presentation  General Appearance: Fairly Groomed  Eye Contact:Fair  Speech:Pressured  Speech Volume:Increased  Handedness:Right  Mood and Affect  Mood:Anxious  Affect:Labile  Thought Process  Thought Processes:Disorganized  Descriptions of Associations:Tangential  Orientation:Other (comment) (person and place)  Thought Content:Illogical; Tangential  History of Schizophrenia/Schizoaffective disorder:No data recorded Duration of Psychotic Symptoms:No data recorded Hallucinations:Hallucinations: None  Ideas of Reference:None  Suicidal Thoughts:Suicidal Thoughts: No  Homicidal Thoughts:Homicidal Thoughts: No  Sensorium  Memory:Immediate Good  Judgment:Poor  Insight:Poor  Executive Functions  Concentration:Poor  Attention Span:Poor  Recall:Poor  Fund of Knowledge:Poor  Language:Fair  Psychomotor Activity  Psychomotor Activity:Psychomotor Activity: Normal  Assets  Assets:Housing  Sleep  Sleep:Sleep: Fair  Physical Exam: Physical Exam Constitutional:      General: She is not in acute distress. HENT:     Head: Normocephalic.     Nose: No congestion or rhinorrhea.  Eyes:     General:        Right eye: No discharge.        Left eye: No discharge.  Cardiovascular:     Rate and Rhythm: Tachycardia present.  Pulmonary:     Effort: No respiratory distress.  Musculoskeletal:        General: No swelling or deformity.     Cervical back: No rigidity.  Neurological:     Motor: No weakness.  Coordination: Coordination normal.   Review of Systems  Constitutional:  Negative for chills and fever.  HENT:  Negative for congestion and sore throat.   Respiratory:  Negative for cough and shortness of breath.   Cardiovascular:  Negative for chest pain.  Gastrointestinal:  Negative for diarrhea, nausea and vomiting.  Genitourinary:  Negative for dysuria.  Musculoskeletal:  Negative for myalgias and neck pain.  Skin:  Negative for itching and  rash.  Neurological:  Negative for tingling, tremors, focal weakness, weakness and headaches.  Psychiatric/Behavioral:  Positive for substance abuse (THC abuse). Negative for depression and suicidal ideas. The patient is nervous/anxious.   Blood pressure 114/76, pulse 93, temperature 98.8 F (37.1 C), resp. rate 18, height  (1.575 m), weight 56.7 kg, SpO2 100 %. Body mass index is 22.86 kg/m.  Treatment Plan Summary: Daily contact with patient to assess and evaluate symptoms and progress in treatment and Medication management  PLAN Safety and Monitoring: Voluntary admission to inpatient psychiatric unit for safety, stabilization and treatment Daily contact with patient to assess and evaluate symptoms and progress in treatment Patient's case to be discussed in multi-disciplinary team meeting Observation Level : q15 minute checks Vital signs: q12 hours Precautions: suicide  Principal Problem: Bipolar I disorder, most recent episode (or current) manic (HCC) -Increase Haldol to 5 mg TID given residual mania - Consider restart of Li once UPT resulted; HCG quant today 1 (patient currently resistant to restart of Li)  Anxiety -Continue Atarax 25 mg TID PRN  EPS Prophylaxis -Start Cogentin 0.5 mg BID  Elevated Heart rate/Anxiety -Start Propranolol 10 mg BID  Other PRNS -Continue Tylenol 650 mg every 6 hours PRN for mild pain -Continue Maalox 30 mg every 4 hrs PRN for indigestion -Continue Milk of Magnesia as needed every 6 hrs for constipation -Continue Haldol IM 5 mg every 6 hrs as needed for agitation -Continue Ativan 2 mg IM every 6 hrs as needed for Agitation -Continue Benadryl 50 mg IM every 6 hours as needed for agitation  Discharge Planning: Social work and case management to assist with discharge planning and identification of hospital follow-up needs prior to discharge Estimated LOS: 5-7 days Discharge Concerns: Need to establish a safety plan; Medication compliance  and effectiveness Discharge Goals: Return home with outpatient referrals for mental health follow-up including medication management/psychotherapy  I certify that inpatient services furnished can reasonably be expected to improve the patient's condition.    Starleen Blue, NP 12/10/20222:49 PM

## 2021-08-04 NOTE — Group Note (Signed)
LCSW Group Therapy Note  08/04/2021    10:00-11:00am   Type of Therapy and Topic:  Group Therapy: Early Messages Received About Anger  Participation Level:  Did Not Attend   Description of Group:   In this group, patients shared and discussed the early messages received in their lives about anger through parental or other adult modeling, teaching, repression, punishment, violence, and more.  Participants identified how those childhood lessons influence even now how they usually or often react when angered.  The group discussed that anger is a secondary emotion and what may be the underlying emotional themes that come out through anger outbursts or that are ignored through anger suppression.    Therapeutic Goals: Patients will identify one or more childhood message about anger that they received and how it was taught to them. Patients will discuss how these childhood experiences have influenced and continue to influence their own expression or repression of anger even today. Patients will explore possible primary emotions that tend to fuel their secondary emotion of anger. Patients will learn that anger itself is normal and cannot be eliminated, and that healthier coping skills can assist with resolving conflict rather than worsening situations.  Summary of Patient Progress:  The patient did not attend this group.  Therapeutic Modalities:   Cognitive Behavioral Therapy Motivation Interviewing  [place], [place] 08/04/2021 12:30 PM

## 2021-08-04 NOTE — Progress Notes (Signed)
  Pt presents with anxiety.  Pt has been asleep.  Pt given snack and hydration.  Pt Right hip abscess assessed.  Right hip is clean, but tender to touch.  Pt denies SI/HI and verbally contracts for safety.  Pt denies AVH.  Administered medication.  Pt remains safe on unit with Q 15 minute safety checks.     08/04/21 2211  Psych Admission Type (Psych Patients Only)  Admission Status Involuntary  Psychosocial Assessment  Patient Complaints Anxiety  Eye Contact Brief  Facial Expression Anxious  Affect Anxious  Speech Logical/coherent  Interaction Assertive  Motor Activity Restless  Appearance/Hygiene Disheveled  Behavior Characteristics Cooperative;Anxious  Mood Pleasant;Anxious  Thought Process  Coherency Circumstantial  Content Blaming others  Delusions None reported or observed  Perception WDL  Hallucination None reported or observed  Judgment Poor  Confusion WDL  Danger to Self  Current suicidal ideation? Denies  Self-Injurious Behavior No self-injurious ideation or behavior indicators observed or expressed   Agreement Not to Harm Self Yes  Description of Agreement verbal contract for safety  Danger to Others  Danger to Others None reported or observed  Danger to Others Abnormal  Harmful Behavior to others No threats or harm toward other people

## 2021-08-05 MED ORDER — LITHIUM CARBONATE ER 300 MG PO TBCR
300.0000 mg | EXTENDED_RELEASE_TABLET | Freq: Two times a day (BID) | ORAL | Status: DC
  Administered 2021-08-05 – 2021-08-08 (×7): 300 mg via ORAL
  Filled 2021-08-05 (×10): qty 1

## 2021-08-05 NOTE — [facility] Group Notes (Signed)
Patient did not attend group today.

## 2021-08-05 NOTE — [facility] Group Notes (Signed)
Pt attended and participated in Child psychotherapist group.

## 2021-08-05 NOTE — Group Note (Signed)
Date:  08/05/2021 Time:  11:17 AM  Group Topic/Focus:  Goals Group:   The focus of this group is to help patients establish daily goals to achieve during treatment and discuss how the patient can incorporate goal setting into their daily lives to aide in recovery.    Participation Level:  Active  Participation Quality:  Appropriate  Affect:  Appropriate  Cognitive:  Appropriate  Insight: Appropriate  Engagement in Group:  Engaged  Modes of Intervention:  Discussion  Additional Comments:  Pt wants to remain positive and attend groups.  Jaquita Rector 08/05/2021, 11:17 AM

## 2021-08-05 NOTE — Group Note (Unsigned)
Date:  08/05/2021 Time:  11:07 AM  Group Topic/Focus:  Goals Group:   The focus of this group is to help patients establish daily goals to achieve during treatment and discuss how the patient can incorporate goal setting into their daily lives to aide in recovery.     Participation Level:  {BHH PARTICIPATION BTDVV:61607}  Participation Quality:  {BHH PARTICIPATION QUALITY:22265}  Affect:  {BHH AFFECT:22266}  Cognitive:  {BHH COGNITIVE:22267}  Insight: {BHH Insight2:20797}  Engagement in Group:  {BHH ENGAGEMENT IN PXTGG:26948}  Modes of Intervention:  {BHH MODES OF INTERVENTION:22269}  Additional Comments:  ***  Jaquita Rector 08/05/2021, 11:07 AM

## 2021-08-05 NOTE — Progress Notes (Signed)
   Pt reports, "I am feeling very lethargic this morning from the medications."  Advised that  provider and day nurse would be updated.

## 2021-08-05 NOTE — [facility] Group Notes (Signed)
Adult Psychoeducational Group Note  Date:  08/05/2021 Time:  8:56 PM  Group Topic/Focus:  Identifying Needs:   The focus of this group is to help patients identify their personal needs that have been historically problematic and identify healthy behaviors to address their needs.  Participation Level:  Active  Participation Quality:  Appropriate  Affect:  Appropriate  Cognitive:  Alert  Insight: Good  Engagement in Group:  Engaged  Modes of Intervention:  Education  Additional Comments  Kaitlyn Cline 08/05/2021, 8:56 PM

## 2021-08-05 NOTE — Progress Notes (Signed)
   08/05/21 2140  Psych Admission Type (Psych Patients Only)  Admission Status Involuntary  Psychosocial Assessment  Patient Complaints Anxiety  Eye Contact Fair;Brief  Facial Expression Animated  Affect Apprehensive  Speech Logical/coherent  Interaction Assertive;Minimal;Guarded  Motor Activity Fidgety  Appearance/Hygiene Unremarkable (WDL)  Behavior Characteristics Cooperative;Appropriate to situation  Mood Pleasant  Thought Process  Coherency Circumstantial  Content Blaming others  Delusions None reported or observed  Perception WDL  Hallucination None reported or observed  Judgment Poor  Confusion None  Danger to Self  Current suicidal ideation? Denies  Self-Injurious Behavior No self-injurious ideation or behavior indicators observed or expressed   Agreement Not to Harm Self Yes  Description of Agreement Verbal contract  Danger to Others  Danger to Others None reported or observed

## 2021-08-05 NOTE — Progress Notes (Signed)
D: Patient complained of some lethargy today. She has concerns regarding her medications and the dosages. I spoke with her at length this morning about same. She is knowledgeable about the indications; however, she appears to be focused on the side effects. Patient is pleasant with staff; she makes frequent requests for items. Patient was restarted on Lithium today; however, refused to take it when offered. Dr. Hazle Quant came on the unit and spoke with patient and she agreed to take it. She has make some references about stopping medication when she is discharged. Explained the importance of taking her medications as directed. Patient states, "I'm not here for suicidal thoughts. The doctor told me I'm bipolar." Her goal today is to "remain positive by attending the groups for today."   A: Continue to monitor medication management and MD orders.  Safety checks completed every 15 minutes per protocol.  Offer support and encouragement as needed.  R: Patient is receptive to staff; her behavior is appropriate.     08/05/21 1700  Psych Admission Type (Psych Patients Only)  Admission Status Involuntary  Psychosocial Assessment  Patient Complaints Anxiety  Eye Contact Fair  Facial Expression Animated  Affect Apprehensive  Speech Logical/coherent  Interaction Assertive  Motor Activity Restless  Appearance/Hygiene Unremarkable  Behavior Characteristics Cooperative  Mood Pleasant  Thought Process  Coherency Circumstantial  Content Blaming others  Delusions None reported or observed  Perception WDL  Hallucination None reported or observed  Judgment Poor  Confusion WDL  Danger to Self  Current suicidal ideation? Denies  Self-Injurious Behavior No self-injurious ideation or behavior indicators observed or expressed   Agreement Not to Harm Self Yes  Description of Agreement verbal  Danger to Others  Danger to Others None reported or observed  Danger to Others Abnormal  Harmful Behavior to others No  threats or harm toward other people  Destructive Behavior No threats or harm toward property

## 2021-08-05 NOTE — Progress Notes (Addendum)
[facility] MD Progress Note  08/05/2021 7:41 AM Kaitlyn Cline  MRN:  [id number]  History of Present Illness:  Patient is a 20 year old female with history of bipolar disorder and cannabis use disorder, who was taken the [facility]) by the police department for making threats to fight her roommates, "blow up the home", hostile & aggressive behaviors, and for making threats to kill herself. Pt was involuntary committed by one of her roommates, and pt was transported to this [facility] for treatment and stabilization of her mood.   Recommendations by the Psychiatry team yesterday -Increase Haldol to 5 mg TID given residual mania -Continue Atarax 25 mg TID PRN -Start Cogentin 0.5 mg BID -Start Propranolol 10 mg BID  Evaluation on unit today Patient reports she is doing better today.  Patient reports she is working on "remaining positive".  Expresses concern that she is sleeping too heavily and that she is lethargic throughout the day.  Patient insistent that she should not be on so much medication and that it is also affecting her right hip wound as well.  Discussed with patient that it is important that she is on lithium and an antipsychotic adjunct for her bipolar disorder.  Expresses agreement to this but is extremely guarded.  Patient eventually after much prompting was agreeable to resuming lithium.  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  Patient denied suicidal/homicidal ideation.  Patient denied auditory/visual hallucinations, paranoia, first rank symptoms, ideas of reference, thought insertion/extraction, thought broadcasting.  Patient reports to eating and sleeping well.  Discussed plan to resume lithium 300 mg twice daily and continue haloperidol as scheduled currently.  Discussed possibility to transition to atypical antipsychotic or potential for LAI.  Patient agrees to consider  options.  UPDATE: Around 1:30 PM, nursing reported to this writer that patient had refused the lithium.  Went to discuss with patient the need to take lithium as a mood stabilizer and patient verbalized concerned that she was on too much medication.  Patient primarily perseverated on the fact that this would exacerbate her wound on her right hip.  Following reassessment of wound, discussed that her wound was actually healing and improving.  Patient felt relieved and then was agreeable to taking lithium again.  Discussed plan would be for patient to eventually only require a mood stabilizer and antipsychotic.  Patient verbalized understanding and had no other questions at this time.  Principal Problem: Bipolar I disorder, most recent episode (or current) manic (HCC) Diagnosis: Principal Problem:   Bipolar I disorder, most recent episode (or current) manic (HCC)  Total Time spent with patient: 20 minutes  Past Psychiatric History: Please see H & P  Past Medical History: History reviewed. No pertinent past medical history. History reviewed. No pertinent surgical history. Family History: History reviewed. No pertinent family history. Family Psychiatric  History: See H & P Social History:  Social History   Substance and Sexual Activity  Alcohol Use Yes     Social History   Substance and Sexual Activity  Drug Use Yes   Types: Marijuana    Social History   Socioeconomic History   Marital status: Single    Spouse name: Not on file   Number of children: Not on file   Years of education: 14   Highest education level: Some college, no degree  Occupational History   Not on file  Tobacco  Use   Smoking status: Never   Smokeless tobacco: Not on file  Vaping Use   Vaping Use: Unknown  Substance and Sexual Activity   Alcohol use: Yes   Drug use: Yes    Types: Marijuana   Sexual activity: Not Currently  Other Topics Concern   Not on file  Social History Narrative   Not on file    Social Determinants of Health   Financial Resource Strain: Not on file  Food Insecurity: Not on file  Transportation Needs: Not on file  Physical Activity: Not on file  Stress: Not on file  Social Connections: Not on file   Sleep: Good  Appetite:  Fair  Current Medications: Current Facility-Administered Medications  Medication Dose Route Frequency Provider Last Rate Last Admin   acetaminophen (TYLENOL) tablet 650 mg  650 mg Oral Q6H PRN Lenard Lance, FNP   650 mg at 08/04/21 1810   alum & mag hydroxide-simeth (MAALOX/MYLANTA) 200-200-20 MG/5ML suspension 30 mL  30 mL Oral Q4H PRN Lenard Lance, FNP       benztropine (COGENTIN) tablet 0.5 mg  0.5 mg Oral BID Starleen Blue, NP   0.5 mg at 08/04/21 2211   cephALEXin (KEFLEX) capsule 500 mg  500 mg Oral BID Lenard Lance, FNP   500 mg at 08/04/21 1800   haloperidol lactate (HALDOL) injection 5 mg  5 mg Intramuscular Q6H PRN Park Pope, MD       And   LORazepam (ATIVAN) injection 2 mg  2 mg Intramuscular Q6H PRN Park Pope, MD       And   diphenhydrAMINE (BENADRYL) injection 50 mg  50 mg Intramuscular Q6H PRN Park Pope, MD       haloperidol (HALDOL) tablet 5 mg  5 mg Oral TID Starleen Blue, NP   5 mg at 08/04/21 2211   hydrOXYzine (ATARAX) tablet 25 mg  25 mg Oral TID PRN Lenard Lance, FNP       magnesium hydroxide (MILK OF MAGNESIA) suspension 30 mL  30 mL Oral Daily PRN Lenard Lance, FNP       propranolol (INDERAL) tablet 10 mg  10 mg Oral BID Starleen Blue, NP   10 mg at 08/04/21 1506    Lab Results:  Results for orders placed or performed during the hospital encounter of 08/02/21 (from the past 48 hour(s))  hCG, quantitative, pregnancy     Status: None   Collection Time: 08/04/21  6:33 AM  Result Value Ref Range   hCG, Beta Chain, Quant, S 1 <5 mIU/mL    Comment:          GEST. AGE      CONC.  (mIU/mL)   <=1 WEEK        5 - 50     2 WEEKS       50 - 500     3 WEEKS       100 - 10,000     4 WEEKS     1,000 -  30,000     5 WEEKS     3,500 - 115,000   6-8 WEEKS     12,000 - 270,000    12 WEEKS     15,000 - 220,000        FEMALE AND NON-PREGNANT FEMALE:     LESS THAN 5 mIU/mL Performed at [facility], [address], [place], [place] [address]   CBC with Differential/Platelet     Status: None  Collection Time: 08/04/21  6:33 AM  Result Value Ref Range   WBC 8.9 4.0 - 10.5 K/uL   RBC 4.07 3.87 - 5.11 MIL/uL   Hemoglobin 12.4 12.0 - 15.0 g/dL   HCT 03.1 59.4 - 58.5 %   MCV 96.1 80.0 - 100.0 fL   MCH 30.5 26.0 - 34.0 pg   MCHC 31.7 30.0 - 36.0 g/dL   RDW 92.9 24.4 - 62.8 %   Platelets 320 150 - 400 K/uL   nRBC 0.0 0.0 - 0.2 %   Neutrophils Relative % 42 %   Neutro Abs 3.7 1.7 - 7.7 K/uL   Lymphocytes Relative 44 %   Lymphs Abs 4.0 0.7 - 4.0 K/uL   Monocytes Relative 10 %   Monocytes Absolute 0.9 0.1 - 1.0 K/uL   Eosinophils Relative 3 %   Eosinophils Absolute 0.3 0.0 - 0.5 K/uL   Basophils Relative 1 %   Basophils Absolute 0.1 0.0 - 0.1 K/uL   Immature Granulocytes 0 %   Abs Immature Granulocytes 0.01 0.00 - 0.07 K/uL    Comment: Performed at [facility], [address] [place], [place] [address]  Pregnancy, urine     Status: None   Collection Time: 08/04/21  6:28 PM  Result Value Ref Range   Preg Test, Ur NEGATIVE NEGATIVE    Comment:        THE SENSITIVITY OF THIS METHODOLOGY IS >20 mIU/mL. Performed at [facility], [address], [place], [place] [address]   hCG, quantitative, pregnancy     Status: None   Collection Time: 08/04/21  6:29 PM  Result Value Ref Range   hCG, Beta Chain, Quant, S <1 <5 mIU/mL    Comment:          GEST. AGE      CONC.  (mIU/mL)   <=1 WEEK        5 - 50     2 WEEKS       50 - 500     3 WEEKS       100 - 10,000     4 WEEKS     1,000 - 30,000     5 WEEKS     3,500 - 115,000   6-8 WEEKS     12,000 - 270,000    12 WEEKS     15,000 - 220,000        FEMALE AND NON-PREGNANT FEMALE:      LESS THAN 5 mIU/mL Performed at [facility], [address] [place], [place] [address]     Blood Alcohol level:  Lab Results  Component Value Date   ETH <10 08/01/2021    Metabolic Disorder Labs: Lab Results  Component Value Date   HGBA1C 5.0 08/01/2021   MPG 96.8 08/01/2021   Lab Results  Component Value Date   PROLACTIN 8.6 08/01/2021   Lab Results  Component Value Date   CHOL 199 08/01/2021   TRIG 39 08/01/2021   HDL 123 08/01/2021   CHOLHDL 1.6 08/01/2021   VLDL 8 08/01/2021   LDLCALC 68 08/01/2021    Physical Findings: AIMS: 0Extremity Movements Upper (arms, wrists, hands, fingers): None, normal Lower (legs, knees, ankles, toes): None, normal CIWA: N/A   COWS:   N/A  Musculoskeletal: Strength & Muscle Tone: within normal limits Gait & Station: normal Patient leans: N/A  Psychiatric Specialty Exam:  Presentation  General Appearance: Fairly Groomed  Eye Contact:Fair  Speech:Rapid and rambling  Speech Volume:Normal  Handedness:Right  Mood and Affect  Mood:Anxious  Affect:Labile  Thought Process  Thought Processes:Disorganized, and ruminative about medication regimen  Descriptions of Associations:Intact  Orientation:grossly oriented to person, place, and time but not to illness   Thought Content:She makes some grandiose statements on exam and is guarded and ruminative about potential medication side-effects. She denies AVH, ideas of reference or first rank symptoms  Hallucinations:Hallucinations: None  Ideas of Reference:None  Suicidal Thoughts:Suicidal Thoughts: No  Homicidal Thoughts:Homicidal Thoughts: No  Sensorium  Memory:Immediate Good  Judgment:Poor - requires prompts to take medications  Insight:Poor  Executive Functions  Concentration: Limited secondary to mania  Attention Span:Limited secondary to mania  Recall:untested  Fund of Knowledge:Fair  Language:Fair  Psychomotor Activity   Psychomotor Activity:Psychomotor Activity: Normal  Assets  Assets:Housing  Sleep  Hours unrecorded  Physical Exam Vitals reviewed.  Constitutional:      General: She is not in acute distress.    Appearance: Normal appearance.  HENT:     Head: Normocephalic.  Pulmonary:     Effort: Pulmonary effort is normal.  Neurological:     General: No focal deficit present.     Mental Status: She is alert.   Review of Systems  Respiratory:  Negative for shortness of breath.   Cardiovascular:  Negative for chest pain.  Gastrointestinal:  Negative for diarrhea, nausea and vomiting.  Neurological:  Negative for headaches.  Psychiatric/Behavioral:  Negative for depression and suicidal ideas.   Blood pressure 114/72, pulse 95, temperature 97.7 F (36.5 C), temperature source Oral, resp. rate 18, height 5\' 2"  (1.575 m), weight 56.7 kg, SpO2 100 %. Body mass index is 22.86 kg/m.  Treatment Plan Summary: Daily contact with patient to assess and evaluate symptoms and progress in treatment and Medication management  PLAN Safety and Monitoring: Voluntary admission to inpatient psychiatric unit for safety, stabilization and treatment Daily contact with patient to assess and evaluate symptoms and progress in treatment Patient's case to be discussed in multi-disciplinary team meeting Observation Level : q15 minute checks Vital signs: q12 hours Precautions: suicide  2. Psychiatric Problem: Bipolar I disorder, most recent episode (or current) manic (HCC) -Continue Haldol 5 mg TID given residual mania. Consider transition to atypical antipsychotic or transition to LAI -Resume Lithium 300 mg bid for mood stabilization (admission TSH and creatinine WNL) - r/b/se/a to Lithium reviewed with patient and she consents to medication trial -Continue Atarax 25 mg TID PRN for anxiety -Continue Cogentin 0.5 mg BID for EPS prophylaxis  Elevated Heart rate/Anxiety -Continue Propranolol 10 mg BID  Medical  Problems Equivocal B HCG -Patient reports she has not had sexual activity with female before -Quantitative beta-hCG 5 and on repeat 1 and then on repeat <1 -UPT negative   Hip Wound -Mild erythema surrounding central wound.  Nonfluctuant.  Patient reports that it has greatly improved  -Keflex 500 mg twice daily  - Repeat WBC down to 8.9 from 13.1  Other PRNS -Continue Tylenol 650 mg every 6 hours PRN for mild pain -Continue Maalox 30 mg every 4 hrs PRN for indigestion -Continue Milk of Magnesia as needed every 6 hrs for constipation -Continue Haldol IM 5 mg every 6 hrs as needed for agitation -Continue Ativan 2 mg IM every 6 hrs as needed for Agitation -Continue Benadryl 50 mg IM every 6 hours as needed for agitation  Discharge Planning: Social work and case management to assist with discharge planning and identification of hospital follow-up needs prior to discharge Estimated LOS: 5-7 days  Discharge Concerns: Need to establish a safety plan; Medication compliance and effectiveness Discharge Goals: Return home with outpatient referrals for mental health follow-up including medication management/psychotherapy  I certify that inpatient services furnished can reasonably be expected to improve the patient's condition.    Park Pope, MD 12/11/20227:41 AM

## 2021-08-05 NOTE — Group Note (Signed)
[facility] LCSW Group Therapy Note  Date/Time:  08/05/2021 10:00-11:00AM  Type of Therapy and Topic:  Group Therapy:  Healthy and Unhealthy Supports plus being "My Own Hero"  Participation Level:  Active   Description of Group: Patients in this group were invited to identify the differences between healthy and unhealthy supports and then to identify those people in their lives who fall into one category or the other, as well as why.  They were then introduced to the idea of adding more healthy supports and decreasing the unhealthy ones.  Patients discussed what additional healthy supports could be helpful in their recovery and wellness after discharge in order to maintain stability.   An emphasis was placed on using counselor, doctor, therapy groups, 12-step groups, and problem-specific support groups to expand supports.  The song "My Own Hero" was played to encourage full participation in their own recovery journey instead of expecting others to do all the work for them.  The song "I Know Where I've Been" was played as further encouragement that they are further in their journey than they were previously.  Therapeutic Goals:   1)  discuss importance of adding supports to stay well once out of the hospital  2)  compare healthy versus unhealthy supports and identify some examples of each  3)  generate ideas and descriptions of healthy supports that can be added  4)  offer mutual support about how to address unhealthy supports  5)  encourage active participation in and adherence to discharge plan    Summary of Patient Progress:  The patient stated that current healthy supports in their life are being in the [facility], while current unhealthy supports include having anxiety in the hospital. The patient expressed a willingness to add appropriate support(s) to help in their recovery journey.   Therapeutic Modalities:   Motivational Interviewing Brief Solution-Focused Therapy  Wayne Brunker [place],  [place] 08/05/2021 1:15 PM

## 2021-08-06 DIAGNOSIS — F3110 Bipolar disorder, current episode manic without psychotic features, unspecified: Secondary | ICD-10-CM

## 2021-08-06 MED ORDER — HALOPERIDOL 5 MG PO TABS
5.0000 mg | ORAL_TABLET | Freq: Every day | ORAL | Status: DC
  Administered 2021-08-07 – 2021-08-08 (×2): 5 mg via ORAL
  Filled 2021-08-06 (×3): qty 1

## 2021-08-06 MED ORDER — HALOPERIDOL 5 MG PO TABS
10.0000 mg | ORAL_TABLET | Freq: Every day | ORAL | Status: DC
  Administered 2021-08-06 – 2021-08-07 (×2): 10 mg via ORAL
  Filled 2021-08-06 (×3): qty 2

## 2021-08-06 NOTE — Group Note (Signed)
[facility] LCSW Group Therapy Note   Group Date: 08/06/2021 Start Time: 1300 End Time: 1400  [facility] LCSW Group Therapy   1:15 PM Type of Therapy: Group Therapy Type of Therapy:  Cognitive Distortions  Participation Level:  Active  Modes of Intervention: Clarification, Confrontation, Discussion, Education, Exploration, Limit-setting, Orientation, Problem-solving, Rapport Building, Dance movement psychotherapist, Socialization and Support  Summary of Progress/Problems: The topic for group today was emotional regulation. This group focused on both positive and negative emotion identification and allowed group members to process ways to identify feelings, regulate negative emotions, and find healthy ways to manage internal/external emotions. Group members were asked to reflect on a time when their reaction to an emotion led to a negative outcome and explored how alternative responses using emotion regulation would have benefited them. Group members were also asked to discuss a time when emotion regulation was utilized when a negative emotion was experienced.   Aram Beecham, LCSWA

## 2021-08-06 NOTE — Progress Notes (Signed)
   08/06/21 0815  Psych Admission Type (Psych Patients Only)  Admission Status Involuntary  Psychosocial Assessment  Patient Complaints Anxiety  Eye Contact Fair;Brief  Facial Expression Animated  Affect Apprehensive  Speech Logical/coherent  Interaction Assertive;Minimal  Motor Activity Fidgety  Appearance/Hygiene Unremarkable  Behavior Characteristics Anxious  Mood Anxious;Pleasant  Thought Process  Coherency Circumstantial  Content Blaming others  Delusions None reported or observed  Perception WDL  Hallucination None reported or observed  Judgment Poor  Confusion None  Danger to Self  Current suicidal ideation? Denies  Self-Injurious Behavior No self-injurious ideation or behavior indicators observed or expressed   Agreement Not to Harm Self Yes  Description of Agreement Verbal contract  Danger to Others  Danger to Others None reported or observed  Pt denies SI/HI/AVH.  Pt endorses feeling anxious about registering for school classes next semester and is requesting assistance from LCSW.    RN actively listened to pt describe feelings and concerns. RN administered medications per providers orders.Pt is calm and cooperative at this time.

## 2021-08-06 NOTE — Progress Notes (Addendum)
[facility] MD Progress Note  08/06/2021 10:32 AM Kaitlyn Cline  MRN:  [id number]  History of Present Illness:  Patient is a 20 year old female with history of bipolar disorder and cannabis use disorder, who was taken the [facility]) by the police department for making threats to fight her roommates, "blow up the home", hostile & aggressive behaviors, and for making threats to kill herself. Pt was involuntary committed by one of her roommates, and pt was transported to this [facility] for treatment and stabilization of her mood.   Recommendations by the Psychiatry team yesterday -Continue Haldol 5 mg TID given residual mania.  -Resume Lithium 300 mg bid for mood stabilization (admission TSH and creatinine WNL)  -Continue Atarax 25 mg TID PRN for anxiety -Continue Cogentin 0.5 mg BID for EPS prophylaxis  Evaluation on unit today Patient reports she is doing mildly better today.  Patient reports she understands that she still requires inpatient psychiatric stabilization; however, she is very anxious about applying to continue her studies next semester as the deadline to apply for everything she reports is December 14.  Patient reports her sleep has been appropriate today and she woke up not feeling groggy.  Patient is still agreeable to taking p.o. medications. Patient denied suicidal/homicidal ideation.  Patient denied auditory/visual hallucinations, paranoia, first rank symptoms, ideas of reference, thought insertion/extraction, thought broadcasting.  Patient reports to eating and sleeping well.  Discussed plan to resume lithium 300 mg twice daily and continue haloperidol at the same dosage but have it 5 mg in morning and 10 mg at night to consolidate dosages. Patient verbalized agreement to plan.  Principal Problem: Bipolar I disorder, most recent episode (or current) manic (HCC) Diagnosis: Principal Problem:   Bipolar I disorder, most recent episode (or current) manic  (HCC)  Total Time spent with patient: 20 minutes  Past Psychiatric History: Please see H & P  Past Medical History: History reviewed. No pertinent past medical history. History reviewed. No pertinent surgical history. Family History: History reviewed. No pertinent family history. Family Psychiatric  History: See H & P Social History:  Social History   Substance and Sexual Activity  Alcohol Use Yes     Social History   Substance and Sexual Activity  Drug Use Yes   Types: Marijuana    Social History   Socioeconomic History   Marital status: Single    Spouse name: Not on file   Number of children: Not on file   Years of education: 14   Highest education level: Some college, no degree  Occupational History   Not on file  Tobacco Use   Smoking status: Never   Smokeless tobacco: Not on file  Vaping Use   Vaping Use: Unknown  Substance and Sexual Activity   Alcohol use: Yes   Drug use: Yes    Types: Marijuana   Sexual activity: Not Currently  Other Topics Concern   Not on file  Social History Narrative   Not on file   Social Determinants of Health   Financial Resource Strain: Not on file  Food Insecurity: Not on file  Transportation Needs: Not on file  Physical Activity: Not on file  Stress: Not on file  Social Connections: Not on file   Sleep: Good  Appetite:  Fair  Current Medications: Current Facility-Administered Medications  Medication Dose Route Frequency Provider Last Rate Last Admin   acetaminophen (TYLENOL) tablet 650 mg  650 mg Oral Q6H PRN Lenard Lance, FNP  650 mg at 08/04/21 1810   alum & mag hydroxide-simeth (MAALOX/MYLANTA) 200-200-20 MG/5ML suspension 30 mL  30 mL Oral Q4H PRN Lenard Lance, FNP       benztropine (COGENTIN) tablet 0.5 mg  0.5 mg Oral BID Starleen Blue, NP   0.5 mg at 08/06/21 0758   cephALEXin (KEFLEX) capsule 500 mg  500 mg Oral BID Lenard Lance, FNP   500 mg at 08/06/21 0758   haloperidol lactate (HALDOL) injection 5 mg   5 mg Intramuscular Q6H PRN Park Pope, MD       And   LORazepam (ATIVAN) injection 2 mg  2 mg Intramuscular Q6H PRN Park Pope, MD       And   diphenhydrAMINE (BENADRYL) injection 50 mg  50 mg Intramuscular Q6H PRN Park Pope, MD       Melene Muller ON 08/07/2021] haloperidol (HALDOL) tablet 5 mg  5 mg Oral Daily Park Pope, MD       And   haloperidol (HALDOL) tablet 10 mg  10 mg Oral Seabron Spates, MD       hydrOXYzine (ATARAX) tablet 25 mg  25 mg Oral TID PRN Lenard Lance, FNP   25 mg at 08/05/21 1311   lithium carbonate (LITHOBID) CR tablet 300 mg  300 mg Oral Q12H Park Pope, MD   300 mg at 08/06/21 0759   magnesium hydroxide (MILK OF MAGNESIA) suspension 30 mL  30 mL Oral Daily PRN Lenard Lance, FNP       propranolol (INDERAL) tablet 10 mg  10 mg Oral BID Comer Locket, MD   10 mg at 08/06/21 4481    Lab Results:  Results for orders placed or performed during the hospital encounter of 08/02/21 (from the past 48 hour(s))  Pregnancy, urine     Status: None   Collection Time: 08/04/21  6:28 PM  Result Value Ref Range   Preg Test, Ur NEGATIVE NEGATIVE    Comment:        THE SENSITIVITY OF THIS METHODOLOGY IS >20 mIU/mL. Performed at [facility], [address] [place], [place] [address]   hCG, quantitative, pregnancy     Status: None   Collection Time: 08/04/21  6:29 PM  Result Value Ref Range   hCG, Beta Chain, Quant, S <1 <5 mIU/mL    Comment:          GEST. AGE      CONC.  (mIU/mL)   <=1 WEEK        5 - 50     2 WEEKS       50 - 500     3 WEEKS       100 - 10,000     4 WEEKS     1,000 - 30,000     5 WEEKS     3,500 - 115,000   6-8 WEEKS     12,000 - 270,000    12 WEEKS     15,000 - 220,000        FEMALE AND NON-PREGNANT FEMALE:     LESS THAN 5 mIU/mL Performed at [facility], [address] [place], [place] [address]     Blood Alcohol level:  Lab Results  Component Value Date   [facility] <10 08/01/2021    Metabolic Disorder  Labs: Lab Results  Component Value Date   HGBA1C 5.0 08/01/2021   MPG 96.8 08/01/2021   Lab Results  Component Value Date  PROLACTIN 8.6 08/01/2021   Lab Results  Component Value Date   CHOL 199 08/01/2021   TRIG 39 08/01/2021   HDL 123 08/01/2021   CHOLHDL 1.6 08/01/2021   VLDL 8 08/01/2021   LDLCALC 68 08/01/2021    Physical Findings: AIMS: 0Extremity Movements Upper (arms, wrists, hands, fingers): None, normal Lower (legs, knees, ankles, toes): None, normal CIWA: N/A   COWS:   N/A  Musculoskeletal: Strength & Muscle Tone: within normal limits Gait & Station: normal Patient leans: N/A  Psychiatric Specialty Exam:  Presentation  General Appearance: Fairly Groomed  Eye Contact:Fair  Speech: Less rapid, less rambling  Speech Volume:Normal  Handedness:Right  Mood and Affect  Mood:anxious but less expansive  Affect:bright appearing, less labile but remains guarded  Thought Process  Thought Processes: less disorganized today but now ruminating regarding discharge planning and applying to resume next semester at [employer]  Descriptions of Associations:Intact  Orientation:oriented to person, place, and current situation  Thought Content:She is guarded and ruminative about applying to resume school at [employer]. She denies AVH, ideas of reference or first rank symptoms; does not make delusional statements today; is not grossly responding to internal/external stimuli on exam  Hallucinations: denies today  Ideas of Reference:None  Suicidal Thoughts: denies today  Homicidal Thoughts: denies today  Sensorium  Memory:Immediate Good  Judgment:Fair - becoming more medication compliant  Insight:Poor  Executive Functions  Concentration: Limited secondary to mania  Attention Span:Limited secondary to mania  Recall:untested  Fund of Knowledge:Fair  Language:Fair  Psychomotor Activity  Psychomotor Activity:normal - no restlessness or  akathisias  Assets  Assets:Housing  Sleep  Hours unrecorded  Physical Exam Vitals reviewed.  Constitutional:      General: She is not in acute distress.    Appearance: Normal appearance.  HENT:     Head: Normocephalic.  Pulmonary:     Effort: Pulmonary effort is normal.  Neurological:     General: No focal deficit present.     Mental Status: She is alert.   Review of Systems  Respiratory:  Negative for shortness of breath.   Cardiovascular:  Negative for chest pain.  Gastrointestinal:  Negative for diarrhea, nausea and vomiting.  Neurological:  Negative for headaches.  Psychiatric/Behavioral:  Negative for depression and suicidal ideas.   Blood pressure 96/75, pulse 95, temperature 98 F (36.7 C), temperature source Oral, resp. rate 18, height  (1.575 m), weight 56.7 kg, SpO2 100 %. Body mass index is 22.86 kg/m.  Treatment Plan Summary: Daily contact with patient to assess and evaluate symptoms and progress in treatment and Medication management  PLAN Safety and Monitoring: Voluntary admission to inpatient psychiatric unit for safety, stabilization and treatment Daily contact with patient to assess and evaluate symptoms and progress in treatment Patient's case to be discussed in multi-disciplinary team meeting Observation Level : q15 minute checks Vital signs: q12 hours Precautions: suicide  2. Psychiatric Problem: Bipolar I disorder, most recent episode (or current) manic (HCC) -CHANGE Haldol to 5 mg in morning and 10 mg at night for Bipolar Disorder - consider transition to atypical antipsychotic and may benefit from LAI prior to discharge -Continue Lithium 300 mg bid for mood stabilization (admission TSH and creatinine WNL) - will check TSH, creatinine, and Li level in 3-4 days -Continue Atarax 25 mg TID PRN for anxiety -Continue Cogentin 0.5 mg BID for EPS prophylaxis  Elevated Heart rate/Anxiety -Continue Propranolol 10 mg BID  Medical  Problems Equivocal B HCG -Patient reports she has not had sexual  activity with female before -Quantitative beta-hCG 5 and on repeat 1 and then on repeat <1 -UPT negative   Hip Wound -Mild erythema surrounding central wound.  Nonfluctuant.  Patient reports that it has greatly improved  -Keflex 500 mg twice daily  - Repeat WBC down to 8.9 from 13.1  Other PRNS -Continue Tylenol 650 mg every 6 hours PRN for mild pain -Continue Maalox 30 mg every 4 hrs PRN for indigestion -Continue Milk of Magnesia as needed every 6 hrs for constipation -Continue Haldol IM 5 mg every 6 hrs as needed for agitation -Continue Ativan 2 mg IM every 6 hrs as needed for Agitation -Continue Benadryl 50 mg IM every 6 hours as needed for agitation  Discharge Planning: Social work and case management to assist with discharge planning and identification of hospital follow-up needs prior to discharge Estimated LOS: 5-7 days Discharge Concerns: Need to establish a safety plan; Medication compliance and effectiveness Discharge Goals: Return home with outpatient referrals for mental health follow-up including medication management/psychotherapy  I certify that inpatient services furnished can reasonably be expected to improve the patient's condition.    Park Pope, MD 12/12/202210:32 AM

## 2021-08-06 NOTE — BHH Counselor (Signed)
CSW spoke with the Pt who asked if the CSW could extend the date for [employer] to enroll in classes.  CSW explained to the Pt that she could not have registration extended at [employer] just for the Pt and that the Pt could call [employer] and ask what assistance may be available to her for registering for classes for the Spring Semester.  The Pt asked CSW how to get a 50C.  CSW had provided the Pt with printed information about a 50C and how to get one in [facility] last week.  The Pt showed the CSW that she still had this paperwork in her folder.  CSW explained to the Pt that she would need to go to the [employer] and file for a 50C or could go to the [facility] and file as well.  CSW also explained that the Pt would then need to take the 50C to her place of employment and show it to them to make them aware that it is against another employee so they can make further arrangements at the work place.  The Pt also asked the CSW about FMLA and Short Term Disability paperwork.  CSW explained that the Pt would need to sign some portions of the FMLA paperwork prior to giving it to the CSW.  CSW also explained that the Pt can drop off this paperwork or have it faxed to [facility] after her discharge.  CSW explained that this type of paperwork will have to come from her employer in the HR department or from whoever the employer goes through for that benefit.  CSW encouraged the Pt to speak with the HR department and their employer to find out how to get that paperwork.  CSW will follow up with the Pt as needed.

## 2021-08-06 NOTE — Progress Notes (Signed)
Patient did not attend the evening speaker AA meeting.  

## 2021-08-06 NOTE — Progress Notes (Signed)
   08/06/21 2154  Psych Admission Type (Psych Patients Only)  Admission Status Involuntary  Psychosocial Assessment  Patient Complaints Anxiety  Eye Contact Fair;Brief  Facial Expression Blank;Flat  Affect Appropriate to circumstance  Speech Logical/coherent  Interaction Assertive;Minimal;Guarded  Motor Activity Fidgety  Appearance/Hygiene Unremarkable  Behavior Characteristics Cooperative;Appropriate to situation  Mood Anxious;Pleasant  Thought Process  Coherency Circumstantial  Content Blaming others  Delusions None reported or observed  Perception WDL  Hallucination None reported or observed  Judgment Poor  Confusion None  Danger to Self  Current suicidal ideation? Denies  Self-Injurious Behavior No self-injurious ideation or behavior indicators observed or expressed   Agreement Not to Harm Self Yes  Description of Agreement Verbal Contract  Danger to Others  Danger to Others None reported or observed

## 2021-08-07 NOTE — Progress Notes (Signed)
°   08/07/21 0830  Psych Admission Type (Psych Patients Only)  Admission Status Involuntary  Psychosocial Assessment  Patient Complaints Anxiety  Eye Contact Fair;Brief  Facial Expression Anxious  Affect Appropriate to circumstance  Speech Logical/coherent  Interaction Assertive;Minimal;Guarded  Motor Activity Fidgety  Appearance/Hygiene Unremarkable  Behavior Characteristics Appropriate to situation;Anxious  Mood Pleasant  Thought Process  Coherency WDL;Blocking  Content WDL  Delusions None reported or observed  Perception WDL  Hallucination None reported or observed  Judgment WDL  Confusion None  Danger to Self  Current suicidal ideation? Denies  Self-Injurious Behavior No self-injurious ideation or behavior indicators observed or expressed   Agreement Not to Harm Self Yes  Description of Agreement Verbal Contract  Danger to Others  Danger to Others None reported or observed  Pt denied SI/HI/AVH. Took meds as prescribed. Pt calm and cooperative.  Pt excited for discharge tomorrow.

## 2021-08-07 NOTE — Group Note (Signed)
Recreation Therapy Group Note   Group Topic:Animal Assisted Therapy   Group Date: 08/07/2021 Start Time: 1430 End Time: 1500 Facilitators: Takera Rayl, Benito Mccreedy, LRT Location: [address] Dayroom   Animal-Assisted Activity (AAA) Program Checklist/Progress Notes Patient Eligibility Criteria Checklist & Daily Group note for Rec Tx Intervention   AAA/T Program Assumption of Risk Form signed by Patient/ or Parent Legal Guardian YES  Patient is free of allergies or severe asthma  YES  Patient reports no fear of animals YES  Patient reports no history of cruelty to animals YES  Patient understands their participation is voluntary YES  Patient washes hands before animal contact YES  Patient washes hands after animal contact YES   Group Description: Patients provided opportunity to interact with trained and credentialed [employer] Therapy dog and the community volunteer/dog handler. Patients practiced appropriate animal interaction and were educated on dog safety outside of the hospital in common community settings. Patients were allowed to use dog toys and other items to practice commands, engage the dog in play, and/or complete routine aspects of animal care. Patients participated with turn taking and structure in place as needed based on number of participants and quality of spontaneous participation delivered.  Goal Area(s) Addresses:  Patient will demonstrate appropriate social skills during group session.  Patient will identify reduction in stress level due to participation in animal assisted therapy session.    Education: Charity fundraiser, Health visitor, Communication & Social Skills    Affect/Mood: Congruent and Euthymic   Participation Level: Engaged   Participation Quality: Independent   Behavior: Attentive , Cooperative, and Interactive    Speech/Thought Process: Directed and Focused   Insight: Moderate   Judgement: Moderate   Modes of Intervention:  Activity, Teaching laboratory technician, and Socialization   Patient Response to Interventions:  Attentive and Interested    Education Outcome:  Acknowledges education   Clinical Observations/Individualized Feedback: Damesha attended session and interacted appropriately with therapy dog and peers. Patient asked appropriate questions about therapy dog and his training. Patient was engaged with alternate group member, attempting to complete pet/animal-theme word searches.    Benito Mccreedy Rodman Recupero, LRT, CTRS 08/07/2021 4:37 PM

## 2021-08-07 NOTE — Progress Notes (Signed)
Pt in stable mood. Pt had minimal interaction with staff and peers during shift. Pt slept majority of the shift. Pt denies SI/HI. Denies AVH. Pt eager and anticipates discharge tomorrow. She is excited tore-enroll in school.     08/07/21 2200  Psych Admission Type (Psych Patients Only)  Admission Status Involuntary  Psychosocial Assessment  Patient Complaints None  Eye Contact Fair;Brief  Facial Expression Flat  Affect Appropriate to circumstance  Speech Logical/coherent  Interaction Minimal  Motor Activity Other (Comment) (WNL)  Appearance/Hygiene Unremarkable  Behavior Characteristics Appropriate to situation  Mood Pleasant  Thought Process  Coherency WDL  Content WDL  Delusions None reported or observed  Perception WDL  Hallucination None reported or observed  Judgment WDL  Confusion None  Danger to Self  Current suicidal ideation? Denies  Self-Injurious Behavior No self-injurious ideation or behavior indicators observed or expressed   Agreement Not to Harm Self Yes  Description of Agreement Verbal Contract  Danger to Others  Danger to Others None reported or observed

## 2021-08-07 NOTE — Progress Notes (Signed)
Psychoeducational Group Note  Date:  08/07/2021 Time:  2049  Group Topic/Focus:  Wrap-Up Group:   The focus of this group is to help patients review their daily goal of treatment and discuss progress on daily workbooks.  Participation Level: Did Not Attend  Participation Quality:  Not Applicable  Affect:  Not Applicable  Cognitive:  Not Applicable  Insight:  Not Applicable  Engagement in Group: Not Applicable  Additional Comments:  The patient did not attend group this evening.   Hazle Coca S 08/07/2021, 8:49 PM

## 2021-08-07 NOTE — Progress Notes (Addendum)
Pt attended orientation /goals group. Presented with logical speech, congruent affect, pleasant mood and forwards in communication.  Goal: To find out my triggers and coping skills for anxiety. "My major trigger I know is school and work. I pay all my bills without family support and go to school. Things have been so hard on me lately so I took time off school".

## 2021-08-07 NOTE — Progress Notes (Addendum)
[facility] MD Progress Note  08/07/2021 3:13 PM Kaitlyn Cline  MRN:  [id number]  History of Present Illness:  Patient is a 20 year old female with history of bipolar disorder and cannabis use disorder, who was taken the [employer] [facility]) by the police department for making threats to fight her roommates, "blow up the home", hostile & aggressive behaviors, and for making threats to kill herself. Pt was involuntary committed by one of her roommates, and pt was transported to this [facility] for treatment and stabilization of her mood.   Recommendations by the Psychiatry team yesterday -CHANGE Haldol to 5 mg in morning and 10 mg at night for Bipolar Disorder - consider transition to atypical antipsychotic and may benefit from LAI prior to discharge -Continue Lithium 300 mg bid for mood stabilization (admission TSH and creatinine WNL) - will check TSH, creatinine, and Li level in 3-4 days -Continue Atarax 25 mg TID PRN for anxiety -Continue Cogentin 0.5 mg BID for EPS prophylaxis  Evaluation on unit today Patient seen and assessed with attending Dr. Sherron Flemings. Patient reports she is doing better today.  Patient feels that she is more in control of her thoughts as well as of her mood.  Patient reports feeling less labile and less agitated.  Patient also reports that she has been able to better focus and made a very organized list of things that she would have to do once she is discharged.  This was very well organized and appeared goal-directed and future oriented.  Patient reports sleeping well and eating well.  Patient reports having regular bowel movements.  Patient wanted providers to look at hip wound which appears to be significantly less erythematous and healing appropriately.  Patient reports occasional pain when she sleeps on it but is otherwise doing well.  Discussed plan to continue her medication as currently scheduled and plan for discharge tomorrow.  Patient very eager and  appreciative of this plan as she would like to complete her FAFSA and apply for next semester.  Discussed importance of adherence to her current medication regimen. We offered LAI but pt declined as he reports having fear of needles. Patient verbalized understanding and agreement that she currently requires medication for continued mood stabilization.  Principal Problem: Bipolar I disorder, most recent episode (or current) manic (HCC) Diagnosis: Principal Problem:   Bipolar I disorder, most recent episode (or current) manic (HCC)  Total Time spent with patient: 20 minutes  Past Psychiatric History: Please see H & P  Past Medical History: History reviewed. No pertinent past medical history. History reviewed. No pertinent surgical history. Family History: History reviewed. No pertinent family history. Family Psychiatric  History: See H & P Social History:  Social History   Substance and Sexual Activity  Alcohol Use Yes     Social History   Substance and Sexual Activity  Drug Use Yes   Types: Marijuana    Social History   Socioeconomic History   Marital status: Single    Spouse name: Not on file   Number of children: Not on file   Years of education: 14   Highest education level: Some college, no degree  Occupational History   Not on file  Tobacco Use   Smoking status: Never   Smokeless tobacco: Not on file  Vaping Use   Vaping Use: Unknown  Substance and Sexual Activity   Alcohol use: Yes   Drug use: Yes    Types: Marijuana   Sexual activity: Not Currently  Other  Topics Concern   Not on file  Social History Narrative   Not on file   Social Determinants of Health   Financial Resource Strain: Not on file  Food Insecurity: Not on file  Transportation Needs: Not on file  Physical Activity: Not on file  Stress: Not on file  Social Connections: Not on file   Sleep: Good  Appetite:  Fair  Current Medications: Current Facility-Administered Medications   Medication Dose Route Frequency Provider Last Rate Last Admin   acetaminophen (TYLENOL) tablet 650 mg  650 mg Oral Q6H PRN Lenard Lance, FNP   650 mg at 08/04/21 1810   alum & mag hydroxide-simeth (MAALOX/MYLANTA) 200-200-20 MG/5ML suspension 30 mL  30 mL Oral Q4H PRN Lenard Lance, FNP       benztropine (COGENTIN) tablet 0.5 mg  0.5 mg Oral BID Starleen Blue, NP   0.5 mg at 08/07/21 0820   cephALEXin (KEFLEX) capsule 500 mg  500 mg Oral BID Lenard Lance, FNP   500 mg at 08/07/21 9233   haloperidol lactate (HALDOL) injection 5 mg  5 mg Intramuscular Q6H PRN Park Pope, MD       And   LORazepam (ATIVAN) injection 2 mg  2 mg Intramuscular Q6H PRN Park Pope, MD       And   diphenhydrAMINE (BENADRYL) injection 50 mg  50 mg Intramuscular Q6H PRN Park Pope, MD       haloperidol (HALDOL) tablet 5 mg  5 mg Oral Daily Park Pope, MD   5 mg at 08/07/21 0076   And   haloperidol (HALDOL) tablet 10 mg  10 mg Oral Seabron Spates, MD   10 mg at 08/06/21 2154   hydrOXYzine (ATARAX) tablet 25 mg  25 mg Oral TID PRN Lenard Lance, FNP   25 mg at 08/05/21 1311   lithium carbonate (LITHOBID) CR tablet 300 mg  300 mg Oral Q12H Park Pope, MD   300 mg at 08/07/21 2263   magnesium hydroxide (MILK OF MAGNESIA) suspension 30 mL  30 mL Oral Daily PRN Lenard Lance, FNP       propranolol (INDERAL) tablet 10 mg  10 mg Oral BID Bartholomew Crews E, MD   10 mg at 08/07/21 3354    Lab Results:  No results found for this or any previous visit (from the past 48 hour(s)).   Blood Alcohol level:  Lab Results  Component Value Date   ETH <10 08/01/2021    Metabolic Disorder Labs: Lab Results  Component Value Date   HGBA1C 5.0 08/01/2021   MPG 96.8 08/01/2021   Lab Results  Component Value Date   PROLACTIN 8.6 08/01/2021   Lab Results  Component Value Date   CHOL 199 08/01/2021   TRIG 39 08/01/2021   HDL 123 08/01/2021   CHOLHDL 1.6 08/01/2021   VLDL 8 08/01/2021   LDLCALC 68 08/01/2021    Physical  Findings: AIMS: 0Extremity Movements Upper (arms, wrists, hands, fingers): None, normal Lower (legs, knees, ankles, toes): None, normal CIWA: N/A   COWS:   N/A  Musculoskeletal: Strength & Muscle Tone: within normal limits Gait & Station: normal Patient leans: N/A  Psychiatric Specialty Exam:  Presentation  General Appearance: Fairly Groomed  Eye Contact:Fair  Speech: nml rate, clear   Speech Volume:Normal  Handedness:Right  Mood and Affect  Mood:anxious. Euthymic, stable  Affect:bright appearing, not labile   Thought Process  Thought Processes: logical, linear   Descriptions of Associations:Intact  Orientation:oriented to  person, place, and current situation  Thought Content: Less guarded and ruminative.  More future and goal-directed.  Hallucinations: denies today  Ideas of Reference:None  Suicidal Thoughts: denies today  Homicidal Thoughts: denies today  Sensorium  Memory:Immediate Good  Judgment:Fair  Insight:Poor  Executive Functions  Concentration: fair  Attention Span: fair  Recall: fair  Fund of Knowledge:Fair  Language:Fair  Psychomotor Activity  Psychomotor Activity:normal - no restlessness or akathisias  Assets  Assets:Housing  Sleep  Hours unrecorded  Physical Exam Vitals reviewed.  Constitutional:      General: She is not in acute distress.    Appearance: Normal appearance.  HENT:     Head: Normocephalic.  Pulmonary:     Effort: Pulmonary effort is normal.  Neurological:     General: No focal deficit present.     Mental Status: She is alert.   Review of Systems  Respiratory:  Negative for shortness of breath.   Cardiovascular:  Negative for chest pain.  Gastrointestinal:  Negative for diarrhea, nausea and vomiting.  Neurological:  Negative for headaches.  Psychiatric/Behavioral:  Negative for depression and suicidal ideas.    Blood pressure 115/70, pulse 68, temperature 98 F (36.7 C), temperature source Oral,  resp. rate 20, height  (1.575 m), weight 56.7 kg, SpO2 98 %. Body mass index is 22.86 kg/m.  Treatment Plan Summary: Daily contact with patient to assess and evaluate symptoms and progress in treatment and Medication management  PLAN Safety and Monitoring: Voluntary admission to inpatient psychiatric unit for safety, stabilization and treatment Daily contact with patient to assess and evaluate symptoms and progress in treatment Patient's case to be discussed in multi-disciplinary team meeting Observation Level : q15 minute checks Vital signs: q12 hours Precautions: suicide  2. Psychiatric Problem: Bipolar I disorder, most recent episode (or current) manic (HCC) -Continue Haldol 5 mg in morning and 10 mg at night for Bipolar Disorder -Continue Lithium 300 mg bid for bipolar disorder  -Continue Atarax 25 mg TID PRN for anxiety -Continue Cogentin 0.5 mg BID for EPS prophylaxis  Elevated Heart rate/Anxiety -Continue Propranolol 10 mg BID  Medical Problems Equivocal B HCG -Patient reports she has not had sexual activity with female before -Quantitative beta-hCG 5 and on repeat 1 and then on repeat <1 -UPT negative   Hip Wound -Mild erythema surrounding central wound.  Nonfluctuant.  Patient reports that it has greatly improved  -Keflex 500 mg twice daily  -Repeat WBC down to 8.9 from 13.1  Other PRNS -Continue Tylenol 650 mg every 6 hours PRN for mild pain -Continue Maalox 30 mg every 4 hrs PRN for indigestion -Continue Milk of Magnesia as needed every 6 hrs for constipation -Continue Haldol IM 5 mg every 6 hrs as needed for agitation -Continue Ativan 2 mg IM every 6 hrs as needed for Agitation -Continue Benadryl 50 mg IM every 6 hours as needed for agitation  Discharge Planning: Social work and case management to assist with discharge planning and identification of hospital follow-up needs prior to discharge Estimated LOS: 5-7 days Discharge Concerns: Need to establish a  safety plan; Medication compliance and effectiveness Discharge Goals: Return home with outpatient referrals for mental health follow-up including medication management/psychotherapy  I certify that inpatient services furnished can reasonably be expected to improve the patient's condition.    Park Pope, MD 12/13/20223:13 PM   Total Time Spent in Direct Patient Care:  I personally spent 30 minutes on the unit in direct patient care. The direct patient care time  included face-to-face time with the patient, reviewing the patient's chart, communicating with other professionals, and coordinating care. Greater than 50% of this time was spent in counseling or coordinating care with the patient regarding goals of hospitalization, psycho-education, and discharge planning needs.  I have independently evaluated the patient during a face-to-face assessment on 08/07/21. I reviewed the patient's chart, and I participated in key portions of the service. I discussed the case with the [employer], and I agree with the assessment and plan of care as documented in the House Officer's note, as addended by me or notated below:  I directly edited the note, as above. Pt has much improvement, stable mood, not manic or psychotic. no s/e to medications. Plan for discharge tomorrow if stability continues.   Phineas Inches, MD Psychiatrist

## 2021-08-08 ENCOUNTER — Encounter (HOSPITAL_COMMUNITY): Payer: Self-pay

## 2021-08-08 LAB — COMPREHENSIVE METABOLIC PANEL
ALT: 16 U/L (ref 0–44)
AST: 18 U/L (ref 15–41)
Albumin: 3.9 g/dL (ref 3.5–5.0)
Alkaline Phosphatase: 42 U/L (ref 38–126)
Anion gap: 9 (ref 5–15)
BUN: 8 mg/dL (ref 6–20)
CO2: 26 mmol/L (ref 22–32)
Calcium: 9.5 mg/dL (ref 8.9–10.3)
Chloride: 101 mmol/L (ref 98–111)
Creatinine, Ser: 0.88 mg/dL (ref 0.44–1.00)
GFR, Estimated: >60 mL/min (ref >60)
Glucose, Bld: 91 mg/dL (ref 70–99)
Potassium: 3.6 mmol/L (ref 3.5–5.1)
Sodium: 136 mmol/L (ref 135–145)
Total Bilirubin: 0.7 mg/dL (ref 0.3–1.2)
Total Protein: 7.4 g/dL (ref 6.5–8.1)

## 2021-08-08 LAB — LITHIUM LEVEL: Lithium Lvl: 0.56 mmol/L — ABNORMAL LOW (ref 0.60–1.20)

## 2021-08-08 LAB — TSH: TSH: 2.119 u[IU]/mL (ref 0.350–4.500)

## 2021-08-08 MED ORDER — HALOPERIDOL 10 MG PO TABS: 10.0000 mg | ORAL_TABLET | Freq: Every day | ORAL | 0 refills | Status: DC

## 2021-08-08 MED ORDER — BENZTROPINE MESYLATE 0.5 MG PO TABS: 0.5000 mg | ORAL_TABLET | Freq: Two times a day (BID) | ORAL | 0 refills | Status: DC

## 2021-08-08 MED ORDER — HALOPERIDOL 5 MG PO TABS: 5.0000 mg | ORAL_TABLET | Freq: Every day | ORAL | 0 refills | Status: DC

## 2021-08-08 MED ORDER — PROPRANOLOL HCL 10 MG PO TABS: 10.0000 mg | ORAL_TABLET | Freq: Two times a day (BID) | ORAL | 0 refills | Status: DC

## 2021-08-08 MED ORDER — LITHIUM CARBONATE ER 300 MG PO TBCR: 300.0000 mg | EXTENDED_RELEASE_TABLET | Freq: Two times a day (BID) | ORAL | 0 refills | Status: DC

## 2021-08-08 NOTE — Progress Notes (Signed)
Discharge note: RN met with pt and reviewed pt's discharge instructions. Pt verbalized understanding of discharge instructions and pt did not have any questions. RN reviewed and provided pt with a copy of SRA, AVS and Transition Record. RN returned pt's belongings to pt.  Samples were given to pt. Pt stated she was very excited to go back home. Pt denied SI/HI/AVH and voiced no concerns. Pt was appreciative of the care pt received at [facility]. Patient discharged to the lobby without incident.

## 2021-08-08 NOTE — BHH Suicide Risk Assessment (Signed)
[facility] Discharge Suicide Risk Assessment   Principal Problem: Bipolar I disorder, most recent episode (or current) manic (HCC) Discharge Diagnoses: Principal Problem:   Bipolar I disorder, most recent episode (or current) manic (HCC)   Total Time spent with patient: 20 minutes  Patient is a 20 year old female with history of bipolar disorder and cannabis use disorder involuntarily presenting to [facility] from behavioral health urgent care due to per roommate " multiple patient states she will kill her self.  Respondent also threatened to blow up the home."  During the patient's hospitalization, patient had extensive initial psychiatric evaluation, and follow-up psychiatric evaluations every day.  Psychiatric diagnoses provided upon initial assessment:  Bipolar Disorder, Type I  Patient's psychiatric medications were adjusted on admission:  -Holding Lithium due concern of pregnancy (Quantitative Hcg 5)             -Lithium Level 0.54 (subtherapeutic) -START haloperidol 5 mg bid until patient's repeat HCG returns -IF patient HCG is negative/stable, plan to resume lithium 300 bid and risperdal 1.5 bid and seroquel 50 qhs and STOP haloperidol  During the hospitalization, other adjustments were made to the patient's psychiatric medication regimen:  -Lithium was re-started after no pregnancy was confirmed -Haldol as increased toZ 5 mg tid -Benztropine was started for EPS prophylaxis  -Propranolol was started for tachycardia -Keflex was continued for skin infection  Gradually, patient started adjusting to milieu.   Patient's care was discussed during the interdisciplinary team meeting every day during the hospitalization.  The patient denied having side effects to prescribed psychiatric medication.  The patient's target psychiatric symptoms of mood lability, disorganized thoughts, SI, and HI, all responded well to the psychiatric medications, and the patient reports overall benefit other  psychiatric hospitalization. Supportive psychotherapy was provided to the patient. The patient also participated in regular group therapy while admitted.   Labs were reviewed with the patient, and abnormal results were discussed with the patient.  The patient denied having suicidal thoughts more than 48 hours prior to discharge.  Patient denies having homicidal thoughts.  Patient denies having auditory hallucinations.  Patient denies any visual hallucinations.  Patient denies having paranoid thoughts.  The patient is able to verbalize their individual safety plan to this provider.  It is recommended to the patient to continue psychiatric medications as prescribed, after discharge from the hospital.    It is recommended to the patient to follow up with your outpatient psychiatric provider and PCP.  Discussed with the patient, the impact of alcohol, drugs, tobacco have been there overall psychiatric and medical wellbeing, and total abstinence from substance use was recommended the patient.      Musculoskeletal: Strength & Muscle Tone: within normal limits Gait & Station: normal Patient leans: N/A  Psychiatric Specialty Exam  Presentation  General Appearance: Appropriate for Environment; Casual; Well Groomed  Eye Contact:Good  Speech:Normal Rate; Clear and Coherent  Speech Volume:Normal  Handedness:Right   Mood and Affect  Mood:Euthymic  Duration of Depression Symptoms: No data recorded Affect:Appropriate; Congruent; Full Range   Thought Process  Thought Processes:Linear  Descriptions of Associations:Intact  Orientation:Full (Time, Place and Person)  Thought Content:Logical  History of Schizophrenia/Schizoaffective disorder:No data recorded Duration of Psychotic Symptoms:No data recorded Hallucinations:Hallucinations: None  Ideas of Reference:None  Suicidal Thoughts:Suicidal Thoughts: No  Homicidal Thoughts:Homicidal Thoughts: No   Sensorium   Memory:Immediate Good; Recent Good; Remote Good  Judgment:Good  Insight:Good   Executive Functions  Concentration:Good  Attention Span:Good  Recall:Good  Fund of Knowledge:Good  Language:Good  Psychomotor Activity  Psychomotor Activity:Psychomotor Activity: Normal   Assets  Assets:Housing   Sleep  Sleep:Sleep: Good   Physical Exam: Physical Exam See discharge summary  ROS See discharge summary  Blood pressure (!) 87/65, pulse (!) 109, temperature 98 F (36.7 C), temperature source Oral, resp. rate 20, height 5\' 2"  (1.575 m), weight 56.7 kg, SpO2 100 %. Body mass index is 22.86 kg/m.  Mental Status Per Nursing Assessment::   On Admission:  Suicidal ideation indicated by others  Demographic factors:  Adolescent or young adult, Gay, lesbian, or bisexual orientation, Low socioeconomic status Loss Factors:  Financial problems / change in socioeconomic status Historical Factors:  Family history of mental illness or substance abuse Risk Reduction Factors:  Employed  Continued Clinical Symptoms:  Bipolar disorder - mood is stable. Pt not manic or psychotic. Denies having suicidal thoughts and homicidal thoughts.   Cognitive Features That Contribute To Risk:  None    Suicide Risk:  Mild:  There are no identifiable suicide plans, no associated intent, mild dysphoria and related symptoms, good self-control (both objective and subjective assessment), few other risk factors, and identifiable protective factors, including available and accessible social support.   Follow-up Information     [facility]. Go to.   Specialty: Behavioral Health Why: Please go to this provider for therapy and medication management services during walk in hours:  Monday through Wednesday from 7:45 am to 11:00 am.  Services are provided on a first come, first served basis. Contact information: [address] [place] [phone]                 Plan Of Care/Follow-up recommendations:   Activity: as tolerated  Diet: heart healthy  Other: -Follow-up with your outpatient psychiatric provider -instructions on appointment date, time, and address (location) are provided to you in discharge paperwork.  -Take your psychiatric medications as prescribed at discharge - instructions are provided to you in the discharge paperwork  -Follow-up with outpatient primary care doctor and other specialists -for management of chronic medical disease, including: skin infection and preventive medicine.   -Testing: Follow-up with outpatient provider for lab results:  08/08/2021 lithium level: 0.56 08/08/2021 TSH 2.119 08/08/2021 BUN 8, creatinine 0.88, GFR over 60  -Recommend abstinence from alcohol, tobacco, and other illicit drug use at discharge.   -If your psychiatric symptoms recur, worsening, or if you have side effects to your psychiatric medications, call your outpatient psychiatric provider, 911, [phone] or go to the nearest emergency department.  -If suicidal thoughts recur, call your outpatient psychiatric provider, 911, [phone] or go to the nearest emergency department.   08/10/2021, MD 08/08/2021, 9:57 AM

## 2021-08-08 NOTE — BHH Group Notes (Signed)
The focus of this group is to help patients establish daily goals to achieve during treatment and discuss how the patient can incorporate goal setting into their daily lives to aide in recovery.  Pt attended morning goals group this morning and had great participation.

## 2021-08-08 NOTE — Discharge Summary (Signed)
Physician Discharge Summary Note  Patient:  Kaitlyn Cline is an 20 y.o., female MRN:  [id number] DOB:  12/08/2000 Patient phone:  [phone] (home)  Patient address:   [address], [place], [place] [address] [place] [address],  Total Time spent with patient: 30 minutes  Date of Admission:  08/02/2021 Date of Discharge: 08/08/2021  Reason for Admission:  Patient is a 20 year old female with history of bipolar disorder and cannabis use disorder involuntarily presenting to [facility] from behavioral health urgent care due to per roommate " multiple patient states she will kill her self.  Respondent also threatened to blow up the home."  PER H&P On assessment this afternoon, patient reports that she is here because she had a "fight or flight" moment.  Patient does not go into much details regarding this.  Patient is very disorganized and equally tangential.  Patient reports "looking for my purpose" as well as she is "running from my family".  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  Patient reports she does not carry any medical or psychiatric diagnoses.  Patient reports her current mood to be "stagnant".  Patient denies suicidal ideations.  Patient denies homicidal ideation by stating "I love babies so no".  Patient states that she is "talking in science" when she refers to her medications and her psychiatric diagnoses.  Patient reports concern for her abrasion that is located her right hip.  Patient reports that there is likely due to "the lithium being too strong".  Patient reports that she can sleep and she has been eating well. Patient does not think she has any mood instability. Patient does report "I may have Bipolar Disorder but I am not sure".  Principal Problem: Bipolar I disorder, most recent episode (or current) manic [facility]) Discharge Diagnoses: Principal Problem:   Bipolar I disorder,  most recent episode (or current) manic (HCC)   Past Psychiatric History: see H&P  Past Medical History: History reviewed. No pertinent past medical history. History reviewed. No pertinent surgical history. Family History: History reviewed. No pertinent family history. Family Psychiatric  History: see H&P Social History:  Social History   Substance and Sexual Activity  Alcohol Use Yes     Social History   Substance and Sexual Activity  Drug Use Yes   Types: Marijuana    Social History   Socioeconomic History   Marital status: Single    Spouse name: Not on file   Number of children: Not on file   Years of education: 14   Highest education level: Some college, no degree  Occupational History   Not on file  Tobacco Use   Smoking status: Never   Smokeless tobacco: Not on file  Vaping Use   Vaping Use: Unknown  Substance and Sexual Activity   Alcohol use: Yes   Drug use: Yes    Types: Marijuana   Sexual activity: Not Currently  Other Topics Concern   Not on file  Social History Narrative   Not on file   Social Determinants of Health   Financial Resource Strain: Not on file  Food Insecurity: Not on file  Transportation Needs: Not on file  Physical Activity: Not on file  Stress: Not on file  Social Connections: Not on file    Hospital Course:   HOSPITAL COURSE   Patient was admitted via emergency department because of worsening of depression and suicidal thoughts.  After admission, patient was started on lithium 300 mg twice daily and initially started on Zyprexa Zydis which was then changed to Haldol 5 mg 2 times daily because of potential positive pregnancy test.  Patient initially complained about sedation during the day due to Haldol; however, patient adjusted to medication well.  Patient pregnancy test was confirmed to be negative; however, patient was psychiatrically stable after titrating Haldol up to 5 3 times daily.  This was consolidated to 5 mg in the  morning and 10 mg at night in order for patient to only require twice daily medication schedule.  Patient was also started on Cogentin 0.5 twice daily as well as Inderal for EPS prophylaxis and anxiety respectively.  Gradually, patient started adjusting to milieu.   Case was discussed in multi disciplinary team meeting.   During later part of hospitalization, mood and affect improved.   Patient denied any hallucinations or delusions and prior to discharge patient repeatedly denied any thoughts intentions or plans of harming her hurting himself or anyone else, that was carefully explored with the patient.     Case was also discussed in multi disciplinary team meeting    Importance of adherence with the treatment and relapse prevention were discussed with the patient.     For detailed history, mental status examination at time of admission, diagnoses and treatment plan, please see the dictated psychiatric evaluation at the time of admission After admission, patient was seen in individual supportive psychotherapy, was encouraged to participate in unit milieu.  Case was also discussed with the staff.   During the later part of hospitalization mood and affect started improving.  Patient was feeling more hopeful.  No side effects from medication reported.  Physical Findings: AIMS: Facial and Oral Movements Muscles of Facial Expression: None, normal Lips and Perioral Area: None, normal Jaw: None, normal Tongue: None, normal,Extremity Movements Upper (arms, wrists, hands, fingers): None, normal Lower (legs, knees, ankles, toes): None, normal, Trunk Movements Neck, shoulders, hips: None, normal, Overall Severity Severity of abnormal movements (highest score from questions above): None, normal Incapacitation due to abnormal movements: None, normal Patient's awareness of abnormal movements (rate only patient's report): No Awareness, Dental Status Current problems with teeth and/or dentures?:  No Does patient usually wear dentures?: No    Musculoskeletal: Strength & Muscle Tone: within normal limits Gait & Station: normal Patient leans: N/A   Psychiatric Specialty Exam:  Presentation  General Appearance: Appropriate for Environment; Casual; Well Groomed  Eye Contact:Good  Speech:Normal Rate; Clear and Coherent  Speech Volume:Normal  Handedness:Right   Mood and Affect  Mood:Euthymic  Affect:Appropriate; Congruent; Full Range   Thought Process  Thought Processes:Linear  Descriptions of Associations:Intact  Orientation:Full (Time, Place and Person)  Thought Content:Logical  History of Schizophrenia/Schizoaffective disorder:No data recorded Duration of Psychotic Symptoms:No data recorded Hallucinations:Hallucinations: None  Ideas of Reference:None  Suicidal Thoughts:Suicidal Thoughts: No  Homicidal Thoughts:Homicidal Thoughts: No   Sensorium  Memory:Immediate Good; Recent Good; Remote Good  Judgment:Good  Insight:Good   Executive Functions  Concentration:Good  Attention Span:Good  Recall:Good  Fund of Knowledge:Good  Language:Good   Psychomotor Activity  Psychomotor Activity:Psychomotor Activity: Normal   Assets  Assets:Housing   Sleep  Sleep:Sleep: Good    Physical Exam: Physical Exam Vitals and nursing note reviewed.  Constitutional:      Appearance: Normal appearance. She is normal weight.  HENT:     Head: Normocephalic and atraumatic.  Pulmonary:     Effort: Pulmonary effort is normal.  Neurological:  General: No focal deficit present.     Mental Status: She is oriented to person, place, and time.   Review of Systems  Respiratory:  Negative for shortness of breath.   Cardiovascular:  Negative for chest pain.  Gastrointestinal:  Negative for abdominal pain, constipation, diarrhea, heartburn, nausea and vomiting.  Neurological:  Negative for headaches.  Blood pressure (!) 87/65, pulse (!) 109, temperature  98 F (36.7 C), temperature source Oral, resp. rate 20, height  (1.575 m), weight 56.7 kg, SpO2 100 %. Body mass index is 22.86 kg/m.   Social History   Tobacco Use  Smoking Status Never  Smokeless Tobacco Not on file   Tobacco Cessation:  N/A, patient does not currently use tobacco products   Blood Alcohol level:  Lab Results  Component Value Date   ETH <10 08/01/2021    Metabolic Disorder Labs:  Lab Results  Component Value Date   HGBA1C 5.0 08/01/2021   MPG 96.8 08/01/2021   Lab Results  Component Value Date   PROLACTIN 8.6 08/01/2021   Lab Results  Component Value Date   CHOL 199 08/01/2021   TRIG 39 08/01/2021   HDL 123 08/01/2021   CHOLHDL 1.6 08/01/2021   VLDL 8 08/01/2021   LDLCALC 68 08/01/2021    See Psychiatric Specialty Exam and Suicide Risk Assessment completed by Attending Physician prior to discharge.  Discharge destination:  Home  Is patient on multiple antipsychotic therapies at discharge:  No   Has Patient had three or more failed trials of antipsychotic monotherapy by history:  No  Recommended Plan for Multiple Antipsychotic Therapies: NA   Allergies as of 08/08/2021   No Known Allergies      Medication List     STOP taking these medications    cephALEXin 500 MG capsule Commonly known as: KEFLEX   mirtazapine 15 MG tablet Commonly known as: REMERON       TAKE these medications      Indication  benztropine 0.5 MG tablet Commonly known as: COGENTIN Take 1 tablet (0.5 mg total) by mouth 2 (two) times daily.  Indication: Extrapyramidal Reaction caused by Medications   haloperidol 10 MG tablet Commonly known as: HALDOL Take 1 tablet (10 mg total) by mouth at bedtime.  Indication: Manic Phase of Manic-Depression   haloperidol 5 MG tablet Commonly known as: HALDOL Take 1 tablet (5 mg total) by mouth daily. Start taking on: August 09, 2021  Indication: Manic Phase of Manic-Depression   lithium carbonate 300 MG  CR tablet Commonly known as: LITHOBID Take 1 tablet (300 mg total) by mouth every 12 (twelve) hours. What changed: when to take this  Indication: Manic-Depression   propranolol 10 MG tablet Commonly known as: INDERAL Take 1 tablet (10 mg total) by mouth 2 (two) times daily.  Indication: Feeling Anxious, Elevated Heart rate        Follow-up Information     [facility]. Go to.   Specialty: Behavioral Health Why: Please go to this provider for therapy and medication management services during walk in hours:  Monday through Wednesday from 7:45 am to 11:00 am.  Services are provided on a first come, first served basis. Contact information: [address] [place] [address] [phone]                 Follow-up recommendations:   Activity:  as tolerated Diet:  heart healthy   Comments:  Prescriptions were given at discharge.  Patient is agreeable  with the discharge plan.  Patient was given an opportunity to ask questions.  Patient appears to feel comfortable with discharge and denies any current suicidal or homicidal thoughts.    Patient is instructed prior to discharge to: Take all medications as prescribed by mental healthcare provider. Report any adverse effects and or reactions from the medicines to outpatient provider promptly. In the event of worsening symptoms, patient is instructed to call the crisis hotline, 911 and or go to the nearest ED for appropriate evaluation and treatment of symptoms. Patient is to follow-up with primary care provider for other medical issues, concerns and or health care needs.    Signed: Park Pope, MD 08/08/2021, 10:08 AM

## 2021-08-08 NOTE — BH IP Treatment Plan (Signed)
Interdisciplinary Treatment and Diagnostic Plan Update  08/08/2021 Kaitlyn Cline MRN: [id number]  Principal Diagnosis: Bipolar I disorder, most recent episode (or current) manic (HCC)  Secondary Diagnoses: Principal Problem:   Bipolar I disorder, most recent episode (or current) manic (HCC)   Current Medications:  Current Facility-Administered Medications  Medication Dose Route Frequency Provider Last Rate Last Admin   acetaminophen (TYLENOL) tablet 650 mg  650 mg Oral Q6H PRN Lenard Lance, FNP   650 mg at 08/04/21 1810   alum & mag hydroxide-simeth (MAALOX/MYLANTA) 200-200-20 MG/5ML suspension 30 mL  30 mL Oral Q4H PRN Lenard Lance, FNP       benztropine (COGENTIN) tablet 0.5 mg  0.5 mg Oral BID Starleen Blue, NP   0.5 mg at 08/08/21 7824   cephALEXin (KEFLEX) capsule 500 mg  500 mg Oral BID Lenard Lance, FNP   500 mg at 08/08/21 2353   haloperidol lactate (HALDOL) injection 5 mg  5 mg Intramuscular Q6H PRN Park Pope, MD       And   LORazepam (ATIVAN) injection 2 mg  2 mg Intramuscular Q6H PRN Park Pope, MD       And   diphenhydrAMINE (BENADRYL) injection 50 mg  50 mg Intramuscular Q6H PRN Park Pope, MD       haloperidol (HALDOL) tablet 5 mg  5 mg Oral Daily Park Pope, MD   5 mg at 08/08/21 6144   And   haloperidol (HALDOL) tablet 10 mg  10 mg Oral Seabron Spates, MD   10 mg at 08/07/21 2214   hydrOXYzine (ATARAX) tablet 25 mg  25 mg Oral TID PRN Lenard Lance, FNP   25 mg at 08/05/21 1311   lithium carbonate (LITHOBID) CR tablet 300 mg  300 mg Oral Q12H Park Pope, MD   300 mg at 08/08/21 3154   magnesium hydroxide (MILK OF MAGNESIA) suspension 30 mL  30 mL Oral Daily PRN Lenard Lance, FNP       propranolol (INDERAL) tablet 10 mg  10 mg Oral BID Bartholomew Crews E, MD   10 mg at 08/08/21 0086   PTA Medications: Medications Prior to Admission  Medication Sig Dispense Refill Last Dose   cephALEXin (KEFLEX) 500 MG capsule Take 1 capsule (500 mg total) by mouth 2 (two) times  daily. 14 capsule 0    lithium carbonate (LITHOBID) 300 MG CR tablet Take 300 mg by mouth in the morning and at bedtime.      mirtazapine (REMERON) 15 MG tablet Take 15 mg by mouth at bedtime.       Patient Stressors: Educational concerns   Financial difficulties   Health problems   Loss of apartment or job   Marital or family conflict   Medication change or noncompliance    Patient Strengths: Manufacturing systems engineer  Physical Health   Treatment Modalities: Medication Management, Group therapy, Case management,  1 to 1 session with clinician, Psychoeducation, Recreational therapy.   Physician Treatment Plan for Primary Diagnosis: Bipolar I disorder, most recent episode (or current) manic (HCC) Long Term Goal(s): Improvement in symptoms so as ready for discharge   Short Term Goals: Ability to identify changes in lifestyle to reduce recurrence of condition will improve Ability to verbalize feelings will improve Ability to demonstrate self-control will improve Ability to identify and develop effective coping behaviors will improve Ability to maintain clinical measurements within normal limits will improve Compliance with prescribed medications will improve Ability to identify triggers associated with substance abuse/mental  health issues will improve  Medication Management: Evaluate patient's response, side effects, and tolerance of medication regimen.  Therapeutic Interventions: 1 to 1 sessions, Unit Group sessions and Medication administration.  Evaluation of Outcomes: Adequate for Discharge  Physician Treatment Plan for Secondary Diagnosis: Principal Problem:   Bipolar I disorder, most recent episode (or current) manic (HCC)  Long Term Goal(s): Improvement in symptoms so as ready for discharge   Short Term Goals: Ability to identify changes in lifestyle to reduce recurrence of condition will improve Ability to verbalize feelings will improve Ability to demonstrate self-control  will improve Ability to identify and develop effective coping behaviors will improve Ability to maintain clinical measurements within normal limits will improve Compliance with prescribed medications will improve Ability to identify triggers associated with substance abuse/mental health issues will improve     Medication Management: Evaluate patient's response, side effects, and tolerance of medication regimen.  Therapeutic Interventions: 1 to 1 sessions, Unit Group sessions and Medication administration.  Evaluation of Outcomes: Adequate for Discharge   RN Treatment Plan for Primary Diagnosis: Bipolar I disorder, most recent episode (or current) manic (HCC) Long Term Goal(s): Knowledge of disease and therapeutic regimen to maintain health will improve  Short Term Goals: Ability to participate in decision making will improve, Ability to verbalize feelings will improve, and Ability to identify and develop effective coping behaviors will improve  Medication Management: RN will administer medications as ordered by provider, will assess and evaluate patient's response and provide education to patient for prescribed medication. RN will report any adverse and/or side effects to prescribing provider.  Therapeutic Interventions: 1 on 1 counseling sessions, Psychoeducation, Medication administration, Evaluate responses to treatment, Monitor vital signs and CBGs as ordered, Perform/monitor CIWA, COWS, AIMS and Fall Risk screenings as ordered, Perform wound care treatments as ordered.  Evaluation of Outcomes: Adequate for Discharge   LCSW Treatment Plan for Primary Diagnosis: Bipolar I disorder, most recent episode (or current) manic (HCC) Long Term Goal(s): Safe transition to appropriate next level of care at discharge, Engage patient in therapeutic group addressing interpersonal concerns.  Short Term Goals: Engage patient in aftercare planning with referrals and resources, Increase emotional  regulation, and Facilitate acceptance of mental health diagnosis and concerns  Therapeutic Interventions: Assess for all discharge needs, 1 to 1 time with Social worker, Explore available resources and support systems, Assess for adequacy in community support network, Educate family and significant other(s) on suicide prevention, Complete Psychosocial Assessment, Interpersonal group therapy.  Evaluation of Outcomes: Adequate for Discharge   Progress in Treatment: Attending groups: Yes. Participating in groups: Yes. Taking medication as prescribed: Yes. Toleration medication: Yes. Family/Significant other contact made: Yes, individual(s) contacted:  declined consents Patient understands diagnosis: Yes. Discussing patient identified problems/goals with staff: Yes. Medical problems stabilized or resolved: Yes. Denies suicidal/homicidal ideation: Yes. Issues/concerns per patient self-inventory: No. Other: None  New problem(s) identified: No, Describe:  None  New Short Term/Long Term Goal(s):medication stabilization, elimination of SI thoughts, development of comprehensive mental wellness plan.   Patient Goals:  "self growth"  Discharge Plan or Barriers: Pt will follow up at [facility] for medication management and therapy.   Reason for Continuation of Hospitalization: Medication stabilization  Estimated Length of Stay: 3-5 days   Scribe for Treatment Team: Chrys Racer 08/08/2021 10:09 AM

## 2021-08-08 NOTE — Progress Notes (Signed)
°  [facility] Adult Case Management Discharge Plan :  Will you be returning to the same living situation after discharge:  Yes,  Home  At discharge, do you have transportation home?: Yes,  Safe Transport  Do you have the ability to pay for your medications: Yes,  Employment   Release of information consent forms completed and in the chart;  Patient's signature needed at discharge.  Patient to Follow up at:  Follow-up Information     [facility]. Go to.   Specialty: Behavioral Health Why: Please go to this provider for therapy and medication management services during walk in hours:  Monday through Wednesday from 7:45 am to 11:00 am.  Services are provided on a first come, first served basis. Contact information: [address] [place] [address] [phone]                Next level of care provider has access to [facility] Link:yes  Safety Planning and Suicide Prevention discussed: Yes,  with patient      Has patient been referred to the Quitline?: N/A patient is not a smoker  Patient has been referred for addiction treatment: N/A  Aram Beecham, LCSWA 08/08/2021, 9:49 AM

## 2021-08-10 ENCOUNTER — Telehealth (HOSPITAL_COMMUNITY): Payer: Self-pay

## 2021-08-10 NOTE — BH Assessment (Signed)
Care Management - Follow Up [facility] Discharges   Patient has been placed in an inpatient psychiatric hospital [facility]) on 08-02-21

## 2021-08-23 ENCOUNTER — Ambulatory Visit (HOSPITAL_COMMUNITY)
Admission: AD | Admit: 2021-08-23 | Discharge: 2021-08-23 | Disposition: A | Discharge status: Home / Self Care | Payer: 59 | Attending: Behavioral Health | Admitting: Behavioral Health

## 2021-08-23 DIAGNOSIS — F3110 Bipolar disorder, current episode manic without psychotic features, unspecified: Secondary | ICD-10-CM | POA: Diagnosis not present

## 2021-08-23 LAB — LITHIUM LEVEL: Lithium Lvl: 0.74 mmol/L (ref 0.60–1.20)

## 2021-08-23 NOTE — Discharge Instructions (Addendum)
Discharge recommendations:  Patient is to take medications as prescribed. Skip next dose of Lithium today due to vomiting. Resume lithium tomorrow on 08/24/21. 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   Please see information for follow-up appointment with psychiatry and therapy.  Please follow up with your primary care provider or urgent care for all medical related needs.   Therapy: We recommend that patient participate in individual therapy to address mental health concerns.  Medications: The parent/guardian is to contact a medical professional and/or outpatient provider to address any new side effects that develop. Parent/guardian should update outpatient providers of any new medications and/or medication changes.   Atypical antipsychotics: If you are prescribed an atypical antipsychotic, it is recommended that your height, weight, BMI, blood pressure, fasting lipid panel, and fasting blood sugar be monitored by your outpatient providers.  Safety:  The patient should abstain from use of illicit substances/drugs and abuse of any medications. If symptoms worsen or do not continue to improve or if the patient becomes actively suicidal or homicidal then it is recommended that the patient return to the closest hospital emergency department, the [facility], or call 911 for further evaluation and treatment. National Suicide Prevention Lifeline 1-800-SUICIDE or (671)025-4019.  About 988 988 offers 24/7 access to trained crisis counselors who can help people experiencing mental health-related distress. People can call or text 988 or chat 988lifeline.org for themselves or if they are worried about a loved one who may need crisis support.   Please contact one of the following facilities to start medication management and therapy services:   [facility]  at [facility] [address] [place] [address]  [place], [place] [address] [phone]   [facility]  [address] [place], [place] [address] [phone]  [facility] [place]  [address] [place]ella Raring [place], [place] [address] [phone]  [facility]  [address]  [place], [place] [address] [phone]  [facility]  [address] [place], [place] [address] [phone]  [facility]  [address],  [place], [place] [address] [phone]

## 2021-08-23 NOTE — ED Provider Notes (Signed)
Behavioral Health Urgent Care Medical Screening Exam  Patient Name: Kaitlyn Cline MRN: [id number] Date of Evaluation: 08/23/21 Diagnosis:  Final diagnoses:  Bipolar I disorder, most recent episode (or current) manic (HCC)    History of Present illness: Kaitlyn Cline is a 20 y.o. female patient who presents to the [facility] voluntarily as a walk-in accompanied by her girlfriend Josie with a chief complaint of "possible medication side effects." Patient has a hx of Bipolar 1 dx.   Patient seen and evaluated face-to-face by this provider, chart reviewed and case discussed with Dr. Gasper Sells. On evaluation, patient is alert and oriented x4. Her thought process is logical and speech is clear and coherent. Her mood is dysphoric and affect is congruent. She denies SI/HI/AVH. There is no indication that she is responding to internal or external stimuli.  Patient reports that she has been throwing up and feeling nauseous for the past 4 to 5 days. She reports mostly vomiting liquids as she is unable to keep anything down. She reports that for the past couple days she has been vomiting after taking her medications. She reports today that she vomited 5 times. She denies abdominal pain. She reports feeling like a zombie and weak after taking the Haldol 10 mg pill at 12 am this morning before she went to work at [employer] (3rd shift).   Patient reports that she is prescribed Lithium every 12 hours, Propranolol, Cogentin, and Haldol 5 mg in the morning and 10 mg at bedtime. She reports that she is compliant with taking the stated medications. Per chart review, patient was discharged from [facility] on 08/08/21 and was prescribed Cogentin 0.5 mg twice daily, Haldol 5 mg daily, and 10 mg nightly, Lithium 300 mg twice daily, and Inderal 10 mg twice daily. Patient was to follow up here at the [facility] outpatient for medication management however, patient has insurance and is unable to follow-up at the [facility].  Patient does not have current outpatient psychiatry or therapy.  Patient denies symptoms of muscle spasms, stiffness to extremities, difficulty walking, involuntary movements, tremors, or confusion. Patient reports having diarrhea 3 days ago x 2 days.  I discussed with the patient checking her lithium level to rule out lithium toxicity. Patient advised to skip evening dose of lithium and resume lithium tomorrow on 08/24/21 due to vomiting. Patient advised to seek medical treatment if she continues to vomit. Patient provided with a list of outpatient resources for psychiatry for medication management.   Psychiatric Specialty Exam  Presentation  General Appearance:Appropriate for Environment  Eye Contact:Fair  Speech:Normal Rate  Speech Volume:Normal  Handedness:Right   Mood and Affect  Mood:Dysphoric  Affect:Congruent   Thought Process  Thought Processes:Coherent; Linear; Goal Directed  Descriptions of Associations:Intact  Orientation:Full (Time, Place and Person)  Thought Content:Logical    Hallucinations:None  Ideas of Reference:None  Suicidal Thoughts:No  Homicidal Thoughts:No   Sensorium  Memory:Immediate Fair; Recent Fair; Remote Fair  Judgment:Fair  Insight:Fair   Executive Functions  Concentration:Fair  Attention Span:Fair  Recall:Fair  Fund of Knowledge:Fair  Language:Fair   Psychomotor Activity  Psychomotor Activity:Normal   Assets  Assets:Communication Skills; Desire for Improvement; Financial Resources/Insurance; Leisure Time; Physical Health; Resilience; Social Support; Transportation; Vocational/Educational   Sleep  Sleep:Fair  Number of hours: No data recorded  No data recorded  Physical Exam: Physical Exam Constitutional:      Appearance: Normal appearance.  HENT:     Head: Normocephalic and atraumatic.     Nose: Nose normal.  Eyes:  Conjunctiva/sclera: Conjunctivae normal.  Cardiovascular:     Rate and Rhythm:  Bradycardia present.  Pulmonary:     Effort: Pulmonary effort is normal.  Musculoskeletal:        General: Normal range of motion.     Cervical back: Normal range of motion.  Neurological:     Mental Status: She is alert and oriented to person, place, and time.   Review of Systems  Constitutional:  Positive for malaise/fatigue.  HENT: Negative.    Eyes: Negative.   Respiratory: Negative.    Cardiovascular: Negative.   Gastrointestinal:  Positive for nausea and vomiting.  Genitourinary: Negative.   Musculoskeletal: Negative.   Skin: Negative.   Neurological: Negative.   Endo/Heme/Allergies: Negative.   Blood pressure 108/79, pulse (!) 55, temperature 98.4 F (36.9 C), temperature source Oral, resp. rate 18, SpO2 100 %. There is no height or weight on file to calculate BMI.  Musculoskeletal: Strength & Muscle Tone: within normal limits Gait & Station: normal Patient leans: N/A   [facility] MSE Discharge Disposition for Follow up and Recommendations: Based on my evaluation the patient does not appear to have an emergency medical condition and can be discharged with resources and follow up care in outpatient services for Medication Management, Individual Therapy, and Group Therapy  Discharge recommendations:  Patient is to take medications as prescribed. Skip next dose of Lithium today due to vomiting. Resume lithium tomorrow on 08/24/21. 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    Please see information for follow-up appointment with psychiatry and therapy.  Please follow up with your primary care provider or urgent care for all medical related needs.   Therapy: We recommend that patient participate in individual therapy to address mental health concerns.  Medications: The parent/guardian is to contact a medical professional and/or outpatient provider to address any new side effects that  develop. Parent/guardian should update outpatient providers of any new medications and/or medication changes.   Atypical antipsychotics: If you are prescribed an atypical antipsychotic, it is recommended that your height, weight, BMI, blood pressure, fasting lipid panel, and fasting blood sugar be monitored by your outpatient providers.  Safety:  The patient should abstain from use of illicit substances/drugs and abuse of any medications. If symptoms worsen or do not continue to improve or if the patient becomes actively suicidal or homicidal then it is recommended that the patient return to the closest hospital emergency department, the [facility], or call 911 for further evaluation and treatment. National Suicide Prevention Lifeline 1-800-SUICIDE or (318) 719-9385.  About 988 988 offers 24/7 access to trained crisis counselors who can help people experiencing mental health-related distress. People can call or text 988 or chat 988lifeline.org for themselves or if they are worried about a loved one who may need crisis support.   Please contact one of the following facilities to start medication management and therapy services:   [facility] [address] [place]  [place], [place] [address] [phone]   [facility]  [address] [place], [place] [address] [phone]  [facility] [place]  [address] [place], [place] [address] [phone]  [facility]  [address]  [place], [place] [address] [phone]  [facility]  [address] [place], [place] [address] [phone]  [facility]  [address],  [place], [place] [address] [phone]  Layla Barter, NP 08/23/2021, 3:08 PM

## 2021-08-23 NOTE — BH Assessment (Signed)
Pt reports feeling sick the past couple days and she believes it is her medications. Pt reports she attempted to go to work and while working she felt nauseous and started vomiting. Pt also reports feeling sluggish and "slow". Pt denies SI, HI, AVH.   Pt is routine.

## 2021-09-03 ENCOUNTER — Telehealth (HOSPITAL_COMMUNITY): Payer: Self-pay

## 2021-09-03 NOTE — BH Assessment (Signed)
Care Management - Follow Up [facility] Discharges   Writer made contact with the patient. Patient reports that she scheduled an appointment to see a pychiatrist on 09/17/2021

## 2021-09-06 ENCOUNTER — Ambulatory Visit (HOSPITAL_COMMUNITY)
Admission: EM | Admit: 2021-09-06 | Discharge: 2021-09-06 | Disposition: A | Discharge status: Home / Self Care | Payer: 59 | Attending: Psychiatry | Admitting: Psychiatry

## 2021-09-06 DIAGNOSIS — F411 Generalized anxiety disorder: Secondary | ICD-10-CM

## 2021-09-06 MED ORDER — HYDROXYZINE PAMOATE 50 MG PO CAPS: 50.0000 mg | ORAL_CAPSULE | Freq: Three times a day (TID) | ORAL | 0 refills | Status: DC | PRN

## 2021-09-06 NOTE — ED Provider Notes (Signed)
Behavioral Health Urgent Care Medical Screening Exam  Patient Name: Kaitlyn Cline MRN: [id number] Date of Evaluation: 09/06/21 Chief Complaint:   Diagnosis:  Final diagnoses:  Anxiety state    History of Present illness: Kaitlyn Cline is a 21 y.o. female. patient presented to [facility] as a walk in  accompanied by her mother with complaints of "anxiety and racing thoughts".  Kaitlyn Cline, 21 y.o., female patient seen face to face by this provider, consulted with Dr. Serafina Mitchell; and chart reviewed on 09/06/21.  On evaluation Kaitlyn Cline reports that she is unable to function at work due to becoming overwhelmed. She stated that she stopped taking her medication (haldol, propanolol, lithium, and cogentin) approximately two weeks ago due to nausea and brain fog. She has an upcoming appointment with her psychiatric provider for medication and therapy. Patient reports that she needs help today for her anxiety until she is able to see her psychiatric provider.   During evaluation Kaitlyn Cline is in sitting position in no acute distress.  She is alert/oriented x 4; calm/cooperative; and mood congruent with affect.  She is speaking in a clear tone at moderate volume, and normal pace; with good eye contact.  Her thought process is coherent and relevant; There is no indication that she is currently responding to internal/external stimuli or experiencing delusional thought content; and she has denied suicidal/self-harm/homicidal ideation, psychosis, and paranoia.   Patient has remained calm throughout assessment and has answered questions appropriately.     At this time Kaitlyn Cline is educated and verbalizes understanding of mental health resources and other crisis services in the community. She is instructed to call 911 and present to the nearest emergency room should she experience any suicidal/homicidal ideation, auditory/visual/hallucinations, or detrimental worsening of her mental health condition.       Psychiatric Specialty Exam  Presentation  General Appearance:Appropriate for Environment  Eye Contact:Good  Speech:Clear and Coherent  Speech Volume:Normal  Handedness:Right   Mood and Affect  Mood:Anxious  Affect:Congruent   Thought Process  Thought Processes:Coherent  Descriptions of Associations:Intact  Orientation:Full (Time, Place and Person)  Thought Content:Logical    Hallucinations:None  Ideas of Reference:None  Suicidal Thoughts:No  Homicidal Thoughts:No   Sensorium  Memory:Immediate Good; Recent Good; Remote Good  Judgment:Good  Insight:Good   Executive Functions  Concentration:Good  Attention Span:Good  [place]  Language:Good   Psychomotor Activity  Psychomotor Activity:Normal   Assets  Assets:Communication Skills; Desire for Improvement; Social Support   Sleep  Sleep:Fair  Number of hours: 4   No data recorded  Physical Exam: Physical Exam Vitals reviewed.  Skin:    General: Skin is warm and dry.  Neurological:     General: No focal deficit present.     Mental Status: She is oriented to person, place, and time.  Psychiatric:        Attention and Perception: Attention normal.        Mood and Affect: Mood is anxious.        Speech: Speech normal.        Behavior: Behavior is cooperative.        Thought Content: Thought content is not paranoid or delusional. Thought content does not include homicidal or suicidal ideation. Thought content does not include homicidal or suicidal plan.        Cognition and Memory: Cognition and memory normal.        Judgment: Judgment normal.   Review of Systems  Psychiatric/Behavioral:  Negative for depression, hallucinations,  memory loss, substance abuse and suicidal ideas. The patient is nervous/anxious and has insomnia.   All other systems reviewed and are negative. Blood pressure 124/86, pulse 76, temperature 98.2 F (36.8 C), temperature source  Oral, resp. rate 18, SpO2 100 %. There is no height or weight on file to calculate BMI.  Musculoskeletal: Strength & Muscle Tone: within normal limits Gait & Station: normal Patient leans: N/A   [facility] MSE Discharge Disposition for Follow up and Recommendations: Based on my evaluation the patient does not appear to have an emergency medical condition and can be discharged with resources and follow up care in outpatient services for Medication Management   Franne Grip, NP 09/06/2021, 4:46 PM

## 2021-09-06 NOTE — ED Triage Notes (Signed)
Pt Kaitlyn Cline presents to [facility] ,accompanied by a family member, with symptoms of depression . Pt states that she has been diagnosed with bipolar and was recently discharged from [facility] approximately 1 week ago but she cannot recall the date. Pt is tearful during triage. Pt states that she feels overwhelmed and she does not feel like herself. Pt states that she would benefit from medication management because she feels as though her medication is not working properly. Pt denies SI/HI and AVH. Pt is routine.

## 2021-09-06 NOTE — Discharge Instructions (Addendum)

## 2021-09-08 ENCOUNTER — Other Ambulatory Visit: Payer: Self-pay

## 2021-09-08 ENCOUNTER — Emergency Department (HOSPITAL_COMMUNITY)
Admission: EM | Admit: 2021-09-08 | Discharge: 2021-09-11 | Disposition: A | Discharge status: Other Acute Inpatient Hospital | Payer: 59 | Attending: Emergency Medicine | Admitting: Emergency Medicine

## 2021-09-08 ENCOUNTER — Encounter (HOSPITAL_COMMUNITY): Payer: Self-pay

## 2021-09-08 DIAGNOSIS — R4689 Other symptoms and signs involving appearance and behavior: Secondary | ICD-10-CM

## 2021-09-08 DIAGNOSIS — F312 Bipolar disorder, current episode manic severe with psychotic features: Secondary | ICD-10-CM | POA: Diagnosis present

## 2021-09-08 DIAGNOSIS — F3174 Bipolar disorder, in full remission, most recent episode manic: Secondary | ICD-10-CM | POA: Diagnosis not present

## 2021-09-08 DIAGNOSIS — R4589 Other symptoms and signs involving emotional state: Secondary | ICD-10-CM | POA: Diagnosis not present

## 2021-09-08 DIAGNOSIS — F29 Unspecified psychosis not due to a substance or known physiological condition: Secondary | ICD-10-CM | POA: Diagnosis not present

## 2021-09-08 DIAGNOSIS — R451 Restlessness and agitation: Secondary | ICD-10-CM | POA: Diagnosis present

## 2021-09-08 DIAGNOSIS — Z046 Encounter for general psychiatric examination, requested by authority: Secondary | ICD-10-CM | POA: Diagnosis not present

## 2021-09-08 DIAGNOSIS — F3110 Bipolar disorder, current episode manic without psychotic features, unspecified: Secondary | ICD-10-CM | POA: Diagnosis present

## 2021-09-08 DIAGNOSIS — Z20822 Contact with and (suspected) exposure to covid-19: Secondary | ICD-10-CM | POA: Insufficient documentation

## 2021-09-08 LAB — COMPREHENSIVE METABOLIC PANEL
ALT: 12 U/L (ref 0–44)
AST: 26 U/L (ref 15–41)
Albumin: 4.6 g/dL (ref 3.5–5.0)
Alkaline Phosphatase: 49 U/L (ref 38–126)
Anion gap: 13 (ref 5–15)
BUN: 8 mg/dL (ref 6–20)
CO2: 21 mmol/L — ABNORMAL LOW (ref 22–32)
Calcium: 9.7 mg/dL (ref 8.9–10.3)
Chloride: 104 mmol/L (ref 98–111)
Creatinine, Ser: 0.84 mg/dL (ref 0.44–1.00)
GFR, Estimated: >60 mL/min (ref >60)
Glucose, Bld: 86 mg/dL (ref 70–99)
Potassium: 3.4 mmol/L — ABNORMAL LOW (ref 3.5–5.1)
Sodium: 138 mmol/L (ref 135–145)
Total Bilirubin: 0.8 mg/dL (ref 0.3–1.2)
Total Protein: 8.2 g/dL — ABNORMAL HIGH (ref 6.5–8.1)

## 2021-09-08 LAB — CBC WITH DIFFERENTIAL/PLATELET
Abs Immature Granulocytes: 0.02 10*3/uL (ref 0.00–0.07)
Basophils Absolute: 0 10*3/uL (ref 0.0–0.1)
Basophils Relative: 1 %
Eosinophils Absolute: 0 10*3/uL (ref 0.0–0.5)
Eosinophils Relative: 0 %
HCT: 41.1 % (ref 36.0–46.0)
Hemoglobin: 13.3 g/dL (ref 12.0–15.0)
Immature Granulocytes: 0 %
Lymphocytes Relative: 29 %
Lymphs Abs: 1.7 10*3/uL (ref 0.7–4.0)
MCH: 31.2 pg (ref 26.0–34.0)
MCHC: 32.4 g/dL (ref 30.0–36.0)
MCV: 96.5 fL (ref 80.0–100.0)
Monocytes Absolute: 0.7 10*3/uL (ref 0.1–1.0)
Monocytes Relative: 11 %
Neutro Abs: 3.4 10*3/uL (ref 1.7–7.7)
Neutrophils Relative %: 59 %
Platelets: 264 10*3/uL (ref 150–400)
RBC: 4.26 MIL/uL (ref 3.87–5.11)
RDW: 13.7 % (ref 11.5–15.5)
WBC: 5.8 10*3/uL (ref 4.0–10.5)
nRBC: 0 % (ref 0.0–0.2)

## 2021-09-08 LAB — RESP PANEL BY RT-PCR (FLU A&B, COVID) ARPGX2
Influenza A by PCR: NEGATIVE
Influenza B by PCR: NEGATIVE
SARS Coronavirus 2 by RT PCR: NEGATIVE

## 2021-09-08 LAB — I-STAT BETA HCG BLOOD, ED (MC, WL, AP ONLY): I-stat hCG, quantitative: <5 m[IU]/mL (ref <5)

## 2021-09-08 LAB — LITHIUM LEVEL: Lithium Lvl: <0.06 mmol/L — ABNORMAL LOW (ref 0.60–1.20)

## 2021-09-08 MED ORDER — SODIUM CHLORIDE 0.9 % IV BOLUS
1000.0000 mL | Freq: Once | INTRAVENOUS | Status: AC
  Administered 2021-09-08: 1000 mL via INTRAVENOUS

## 2021-09-08 MED ORDER — OLANZAPINE 10 MG PO TBDP
10.0000 mg | ORAL_TABLET | Freq: Three times a day (TID) | ORAL | Status: DC | PRN
  Administered 2021-09-08 – 2021-09-09 (×2): 10 mg via ORAL
  Filled 2021-09-08 (×2): qty 1

## 2021-09-08 MED ORDER — LORAZEPAM 2 MG/ML IJ SOLN
1.0000 mg | Freq: Once | INTRAMUSCULAR | Status: AC
  Administered 2021-09-08: 1 mg via INTRAMUSCULAR
  Filled 2021-09-08: qty 1

## 2021-09-08 MED ORDER — ZIPRASIDONE MESYLATE 20 MG IM SOLR: 20.0000 mg | INTRAMUSCULAR | Status: DC | PRN

## 2021-09-08 MED ORDER — LORAZEPAM 1 MG PO TABS
1.0000 mg | ORAL_TABLET | ORAL | Status: AC | PRN
  Administered 2021-09-11: 1 mg via ORAL
  Filled 2021-09-08: qty 1

## 2021-09-08 MED ORDER — STERILE WATER FOR INJECTION IJ SOLN
INTRAMUSCULAR | Status: AC
  Filled 2021-09-08: qty 10

## 2021-09-08 MED ORDER — ZIPRASIDONE MESYLATE 20 MG IM SOLR
10.0000 mg | Freq: Once | INTRAMUSCULAR | Status: AC
  Administered 2021-09-08: 10 mg via INTRAMUSCULAR
  Filled 2021-09-08: qty 20

## 2021-09-08 NOTE — ED Notes (Signed)
MD Tyrone Nine notified of patients heart rate in 140-150 range.

## 2021-09-08 NOTE — ED Notes (Signed)
Pt brought from [facility]. She continues to shout and repeat phrases. Pt non-redirectable. MD notified. See MAR.

## 2021-09-08 NOTE — ED Notes (Signed)
Pt was provided ED phone to make requested phone calls. Pt has been requesting to be discharged. Pt has been told she is IVC'd and cannot be discharged yet.

## 2021-09-08 NOTE — ED Notes (Addendum)
This writer assigned to sit with this pt. Upon initial encounter pt states " I don't need you here and your not black." Pt asks me to remove my mask. Pt informed that I can not do so. Police officer has arrived and is at the pt bedside. Pt continues to ask for belongings.

## 2021-09-08 NOTE — ED Notes (Signed)
Patient assessed and vital signs obtained. Member resting in bed and no needs at this time. Alert and oriented x 3. No complaints of pain. Offered food and drink, and she declined. Patient stated she was cold, warm blankets given to patient. JRP, RN

## 2021-09-08 NOTE — ED Provider Notes (Signed)
[facility]-EMERGENCY DEPT Provider Note   CSN: [id number] Arrival date & time: 09/08/21  0426     History  Chief Complaint  Patient presents with   Psychiatric Evaluation    IVC    Kaitlyn Cline is a 21 y.o. female.  21 yo F with a chief complaints of acute agitation.  The patient has a history of bipolar disorder and reportedly has come off of her medications.  Per EMS she was walking around the house naked and was a bit agitated.  Got into an altercation with the EMS providers and required police involvement.  Was given Haldol and Ativan in route.       Home Medications Prior to Admission medications   Medication Sig Start Date End Date Taking? Authorizing Provider  benztropine (COGENTIN) 0.5 MG tablet Take 1 tablet (0.5 mg total) by mouth 2 (two) times daily. 08/08/21 09/07/21  Park Pope, MD  haloperidol (HALDOL) 10 MG tablet Take 1 tablet (10 mg total) by mouth at bedtime. 08/08/21 09/07/21  Park Pope, MD  haloperidol (HALDOL) 5 MG tablet Take 1 tablet (5 mg total) by mouth daily. 08/09/21 09/08/21  Park Pope, MD  hydrOXYzine (VISTARIL) 50 MG capsule Take 1 capsule (50 mg total) by mouth 3 (three) times daily as needed for anxiety. 09/06/21   Penn, Cranston Neighbor, NP  lithium carbonate (LITHOBID) 300 MG CR tablet Take 1 tablet (300 mg total) by mouth every 12 (twelve) hours. 08/08/21 09/07/21  Park Pope, MD  propranolol (INDERAL) 10 MG tablet Take 1 tablet (10 mg total) by mouth 2 (two) times daily. 08/08/21 09/07/21  Park Pope, MD      Allergies    Patient has no known allergies.    Review of Systems   Review of Systems  Constitutional:  Negative for chills and fever.  HENT:  Negative for congestion and rhinorrhea.   Eyes:  Negative for redness and visual disturbance.  Respiratory:  Negative for shortness of breath and wheezing.   Cardiovascular:  Negative for chest pain and palpitations.  Gastrointestinal:  Negative for nausea and vomiting.   Genitourinary:  Negative for dysuria and urgency.  Musculoskeletal:  Negative for arthralgias and myalgias.  Skin:  Negative for pallor and wound.  Neurological:  Negative for dizziness and headaches.  Psychiatric/Behavioral:  Positive for agitation and behavioral problems. Negative for confusion. The patient is nervous/anxious and is hyperactive.    Physical Exam Updated Vital Signs BP (!) 123/55    Pulse (!) 108    Temp (!) 97.2 F (36.2 C)    Resp 17    Ht 5\' 6"  (1.676 m)    Wt 61.2 kg    SpO2 100%    BMI 21.79 kg/m  Physical Exam Vitals and nursing note reviewed.  Constitutional:      General: She is not in acute distress.    Appearance: She is well-developed. She is not diaphoretic.  HENT:     Head: Normocephalic and atraumatic.  Eyes:     Pupils: Pupils are equal, round, and reactive to light.  Cardiovascular:     Rate and Rhythm: Normal rate and regular rhythm.     Heart sounds: No murmur heard.   No friction rub. No gallop.  Pulmonary:     Effort: Pulmonary effort is normal.     Breath sounds: No wheezing or rales.  Abdominal:     General: There is no distension.     Palpations: Abdomen is soft.     Tenderness:  There is no abdominal tenderness.  Musculoskeletal:        General: No tenderness.     Cervical back: Normal range of motion and neck supple.  Skin:    General: Skin is warm and dry.  Neurological:     Mental Status: She is alert and oriented to person, place, and time.  Psychiatric:        Behavior: Behavior normal.    ED Results / Procedures / Treatments   Labs (all labs ordered are listed, but only abnormal results are displayed) Labs Reviewed  RESP PANEL BY RT-PCR (FLU A&B, COVID) ARPGX2  CBC WITH DIFFERENTIAL/PLATELET  COMPREHENSIVE METABOLIC PANEL  LITHIUM LEVEL  I-STAT BETA HCG BLOOD, ED ([facility], [facility], [facility] ONLY)    EKG None  Radiology No results found.  Procedures Procedures    Medications Ordered in ED Medications  sodium chloride 0.9  % bolus 1,000 mL (0 mLs Intravenous Stopped 09/08/21 [id number])    ED Course/ Medical Decision Making/ A&P                           Medical Decision Making  21 yo F with a significant past medical history of bipolar disorder comes in with a chief complaints of increased agitation.  I reviewed the patient's medical record and the patient was actually seen yesterday at a behavioral health setting and was deemed to be safe for discharge home.  She is currently under IVC by police.  I feel she is medically clear.  TTS evaluation.  Patient blood pressure trended down.  ? Sedation given iv fluids.  BP now better, patient a bit agitated, HR in the 120's, will send cbc, cmp lithium.   The patients results and plan were reviewed and discussed.   Any x-rays performed were independently reviewed by myself.   Differential diagnosis were considered with the presenting HPI.  Signed out to Dr. Roslynn Amble, please see his note for further details of care in the ED.   The patients results and plan were reviewed and discussed.   Any x-rays performed were independently reviewed by myself.   Differential diagnosis were considered with the presenting HPI.  Medications  sodium chloride 0.9 % bolus 1,000 mL (0 mLs Intravenous Stopped 09/08/21 0635)    Vitals:   09/08/21 0550 09/08/21 0552 09/08/21 0615 09/08/21 0630  BP: (!) 94/53 (!) 90/56 103/65 (!) 123/55  Pulse: 71 76  (!) 108  Resp: 14 16 (!) 22 17  Temp: 97.6 F (36.4 C) 97.6 F (36.4 C)  (!) 97.2 F (36.2 C)  TempSrc:  Oral    SpO2: 100% 99%  100%  Weight:      Height:        Final diagnoses:  Aggressive behavior    Admission/ observation were discussed with the admitting physician, patient and/or family and they are comfortable with the plan.    Medications  sodium chloride 0.9 % bolus 1,000 mL (0 mLs Intravenous Stopped 09/08/21 0635)    Vitals:   09/08/21 0550 09/08/21 0552 09/08/21 0615 09/08/21 0630  BP: (!) 94/53 (!) 90/56  103/65 (!) 123/55  Pulse: 71 76  (!) 108  Resp: 14 16 (!) 22 17  Temp: 97.6 F (36.4 C) 97.6 F (36.4 C)  (!) 97.2 F (36.2 C)  TempSrc:  Oral    SpO2: 100% 99%  100%  Weight:      Height:        Final diagnoses:  Aggressive behavior    Admission/ observation were discussed with the admitting physician, patient and/or family and they are comfortable with the plan.         Final Clinical Impression(s) / ED Diagnoses Final diagnoses:  Aggressive behavior    Rx / DC Orders ED Discharge Orders     None         Deno Etienne, DO 09/08/21 [phone]
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
## 2021-09-08 NOTE — ED Notes (Addendum)
Pt informed this Probation officer that she needed to use the restroom. This tech walked with the pt to the restroom. The pt then walked into the bathroom stating she didn't want to close the door, dropped her pants and stood in the doorway exposing herself. The pt did not void. This tech then began to close the door for the pts privacy. The pt then stated she did not have to use the restroom. The pt pulled her pants back up and returned to the bedside. The pt continues to yell and disturb other pts and staff nearby. Many attempts have been made to redirect the pt. Redirection has not been achieved at this time.

## 2021-09-08 NOTE — ED Notes (Signed)
Pt provided with a [place] sandwich and a sprite. Pt continues to be repetitive and loud, although is redirectable.

## 2021-09-08 NOTE — ED Notes (Addendum)
Pt continuously stating "HIPAA im 16."

## 2021-09-08 NOTE — ED Notes (Signed)
Patient continually pressing call button , screaming "I want a Child psychotherapist and want to discharge". Patient educated that sw cannot discharge patient. Patient will participate in rounds with [facility] team. Patient asked this nurse "do I stink? Why are you wearing a mask?" This nurse explained covid protocols. Patient verbalized understanding. As nurse left the room, patient began pressing call bell again and yelling. MD notified of patients agitation.

## 2021-09-08 NOTE — ED Notes (Signed)
Discussed with Dr. Roslynn Amble pt's continued outbursts and inability to be redirected despite PRN medications given. New order given for 10 mg IM Geodon due to other medications given recently.

## 2021-09-08 NOTE — ED Notes (Signed)
Pt becomes easily agitated as security rounds on the pt and this Clinical research associate. After security leaves the pt returns back to the bedside and continues her suduko. The pt states she does not like security or female figures. Pt states "they scare me."

## 2021-09-08 NOTE — ED Triage Notes (Signed)
Pt arrived by ems.  Was in and out of shower, walking around her house naked, and fighting with family members. Has been off of her lithium for a few days. Supposed to see a new counselor next week to restart meds.  Was given 5mg  of haldol and 5 mg of versed prior to arrival.

## 2021-09-09 DIAGNOSIS — R4589 Other symptoms and signs involving emotional state: Secondary | ICD-10-CM

## 2021-09-09 MED ORDER — HALOPERIDOL 5 MG PO TABS
5.0000 mg | ORAL_TABLET | Freq: Two times a day (BID) | ORAL | Status: DC
  Administered 2021-09-10 – 2021-09-11 (×3): 5 mg via ORAL
  Filled 2021-09-09 (×3): qty 1

## 2021-09-09 NOTE — Progress Notes (Signed)
Patient meets criteria for inpatient treatment per Hillery Jacks, NP. No appropriate beds at [facility] currently. CSW faxed referrals to the following facilities for review:  [facility]   [facility]   [facility]   [facility]   [facility]   [facility]   [facility]   [facility]   [facility]   [facility]   [facility]   [facility]   [facility]   [facility]   [facility]   [facility]   [facility]     TTS will continue to seek bed placement.     Ruthann Cancer MSW, LCSW Clincal Social Worker  [facility]

## 2021-09-09 NOTE — ED Provider Notes (Signed)
Emergency Medicine Observation Re-evaluation Note  Kaitlyn Cline is a 21 y.o. female, seen on rounds today.  Pt initially presented to the ED for complaints of Psychiatric Evaluation (IVC) Currently, the patient is eating breakfast and calm.  Physical Exam  BP 118/67 (BP Location: Right Arm)    Pulse 96    Temp 99.3 F (37.4 C) (Oral)    Resp 18    Ht 5\' 6"  (1.676 m)    Wt 61.2 kg    SpO2 100%    BMI 21.79 kg/m  Physical Exam General: awake and alert Cardiac: rrr Lungs: cta b Psych: calm  ED Course / MDM  EKG:   I have reviewed the labs performed to date as well as medications administered while in observation.  Recent changes in the last 24 hours include:  TTS evaluated pt who recommended inpatient treatment.  Plan  Current plan is for awaiting placement. Kaitlyn Cline is under involuntary commitment.      , MD 09/09/21 1002

## 2021-09-09 NOTE — ED Notes (Signed)
Pt go her lunch tray

## 2021-09-09 NOTE — Consult Note (Addendum)
[place] Psychiatry Consult   Reason for Consult:  Bipolar, most recent episode or current manic  Referring Physician:  Emergency Dept.  Patient Identification: Kaitlyn Cline MRN:  [id number] Principal Diagnosis: Bipolar I disorder, most recent episode (or current) manic ([place]) Diagnosis:  Principal Problem:   Bipolar I disorder, most recent episode (or current) manic ([place])   Total Time spent with patient: 15 minutes  Subjective:   Kaitlyn Cline is a 21 y.o. female was seen and evaluated by psychiatric nurse practitioner and counselor T. Henrene Pastor.  Patient observed sitting in the hallway completing Sodoku puzzle. She presented calm, flat and slightly guarded.  She denied suicidal or homicidal ideations.  Denies auditory or visual hallucinations.  Reports " I was acting crazy on yesterday."  Stated she has not been taking medications as indicated.  Reports she was on lithium and self discontinued medication because she states she did not like the way the medications made her feel.  States she felt sluggish at work. Patient reported she is currently employed by [employer].   Denied that she is currently attending college classes at this time.  Reports she resides with 3 other roommates.  Patient was asked about additional support system i.e. parents.  Patient became tearful and stated that she is in contact with her family.  " we just do not communicate."    Kaitlyn Cline she reports occasional marijuana use.  Denies alcohol use.  No other illicit drug use noted.   HPI:  Kaitlyn Cline is a 21 year old female who presents involuntary and unaccompanied to [facility] with ongoing delusions and psychosis. Per IVC patient has been responding to internal stimuli and talking to herself along with display other bizarre behaviors. Patient is observed to be delusional/psychotic at the time of assessment and renders limited history this date. Patient reports she "doesn't want to talk to a man that is the devil." Patient is  refusing to participate in the assessment and keeps repeating "please leave sir." Patient is observed to be getting increasingly agitated as this Probation officer attempts to redirect. Patient does deny any S/I, H/I or AVH although this writer is uncertain if patient is comprehending the content of this writer's questions. Information to complete assessment was obtained from admission notes and previous history. Patient was seen and admitted on 08/01/21. Assessment below was used to complete history this date  Past Psychiatric History:  Bipolar Disorder, mania    Risk to Self:   Risk to Others:   Prior Inpatient Therapy:   Prior Outpatient Therapy:    Past Medical History: History reviewed. No pertinent past medical history. History reviewed. No pertinent surgical history. Family History: History reviewed. No pertinent family history. Family Psychiatric  History:  Social History:  Social History   Substance and Sexual Activity  Alcohol Use Yes     Social History   Substance and Sexual Activity  Drug Use Yes   Types: Marijuana    Social History   Socioeconomic History   Marital status: Single    Spouse name: Not on file   Number of children: Not on file   Years of education: 14   Highest education level: Some college, no degree  Occupational History   Not on file  Tobacco Use   Smoking status: Never   Smokeless tobacco: Not on file  Vaping Use   Vaping Use: Unknown  Substance and Sexual Activity   Alcohol use: Yes   Drug use: Yes    Types: Marijuana   Sexual  activity: Not Currently  Other Topics Concern   Not on file  Social History Narrative   Not on file   Social Determinants of Health   Financial Resource Strain: Not on file  Food Insecurity: Not on file  Transportation Needs: Not on file  Physical Activity: Not on file  Stress: Not on file  Social Connections: Not on file   Additional Social History:    Allergies:  No Known Allergies  Labs:  Results for orders  placed or performed during the hospital encounter of 09/08/21 (from the past 48 hour(s))  Resp Panel by RT-PCR (Flu A&B, Covid) Nasopharyngeal Swab     Status: None   Collection Time: 09/08/21  4:33 AM   Specimen: Nasopharyngeal Swab; Nasopharyngeal(NP) swabs in vial transport medium  Result Value Ref Range   SARS Coronavirus 2 by RT PCR NEGATIVE NEGATIVE    Comment: (NOTE) SARS-CoV-2 target nucleic acids are NOT DETECTED.  The SARS-CoV-2 RNA is generally detectable in upper respiratory specimens during the acute phase of infection. The lowest concentration of SARS-CoV-2 viral copies this assay can detect is 138 copies/mL. A negative result does not preclude SARS-Cov-2 infection and should not be used as the sole basis for treatment or other patient management decisions. A negative result may occur with  improper specimen collection/handling, submission of specimen other than nasopharyngeal swab, presence of viral mutation(s) within the areas targeted by this assay, and inadequate number of viral copies(<138 copies/mL). A negative result must be combined with clinical observations, patient history, and epidemiological information. The expected result is Negative.  Fact Sheet for Patients:  EntrepreneurPulse.com.au  Fact Sheet for Healthcare Providers:  IncredibleEmployment.be  This test is no t yet approved or cleared by the [place] FDA and  has been authorized for detection and/or diagnosis of SARS-CoV-2 by FDA under an Emergency Use Authorization (EUA). This EUA will remain  in effect (meaning this test can be used) for the duration of the COVID-19 declaration under Section 564(b)(1) of the Act, 21 U.S.C.section 360bbb-3(b)(1), unless the authorization is terminated  or revoked sooner.       Influenza A by PCR NEGATIVE NEGATIVE   Influenza B by PCR NEGATIVE NEGATIVE    Comment: (NOTE) The Xpert Xpress SARS-CoV-2/FLU/RSV plus assay is  intended as an aid in the diagnosis of influenza from Nasopharyngeal swab specimens and should not be used as a sole basis for treatment. Nasal washings and aspirates are unacceptable for Xpert Xpress SARS-CoV-2/FLU/RSV testing.  Fact Sheet for Patients: EntrepreneurPulse.com.au  Fact Sheet for Healthcare Providers: IncredibleEmployment.be  This test is not yet approved or cleared by the [place] FDA and has been authorized for detection and/or diagnosis of SARS-CoV-2 by FDA under an Emergency Use Authorization (EUA). This EUA will remain in effect (meaning this test can be used) for the duration of the COVID-19 declaration under Section 564(b)(1) of the Act, 21 U.S.C. section 360bbb-3(b)(1), unless the authorization is terminated or revoked.  Performed at [facility], [place] [address] [place], [place] [address]   CBC with Differential     Status: None   Collection Time: 09/08/21  4:50 AM  Result Value Ref Range   WBC 5.8 4.0 - 10.5 K/uL   RBC 4.26 3.87 - 5.11 MIL/uL   Hemoglobin 13.3 12.0 - 15.0 g/dL   HCT 41.1 36.0 - 46.0 %   MCV 96.5 80.0 - 100.0 fL   MCH 31.2 26.0 - 34.0 pg   MCHC 32.4 30.0 - 36.0 g/dL  RDW 13.7 11.5 - 15.5 %   Platelets 264 150 - 400 K/uL   nRBC 0.0 0.0 - 0.2 %   Neutrophils Relative % 59 %   Neutro Abs 3.4 1.7 - 7.7 K/uL   Lymphocytes Relative 29 %   Lymphs Abs 1.7 0.7 - 4.0 K/uL   Monocytes Relative 11 %   Monocytes Absolute 0.7 0.1 - 1.0 K/uL   Eosinophils Relative 0 %   Eosinophils Absolute 0.0 0.0 - 0.5 K/uL   Basophils Relative 1 %   Basophils Absolute 0.0 0.0 - 0.1 K/uL   Immature Granulocytes 0 %   Abs Immature Granulocytes 0.02 0.00 - 0.07 K/uL    Comment: Performed at [facility], [place] [address], [place], [place] [address]  Comprehensive metabolic panel     Status: Abnormal   Collection Time: 09/08/21  4:50 AM  Result Value Ref Range   Sodium 138 135 -  145 mmol/L   Potassium 3.4 (L) 3.5 - 5.1 mmol/L   Chloride 104 98 - 111 mmol/L   CO2 21 (L) 22 - 32 mmol/L   Glucose, Bld 86 70 - 99 mg/dL    Comment: Glucose reference range applies only to samples taken after fasting for at least 8 hours.   BUN 8 6 - 20 mg/dL   Creatinine, Ser 0.84 0.44 - 1.00 mg/dL   Calcium 9.7 8.9 - 10.3 mg/dL   Total Protein 8.2 (H) 6.5 - 8.1 g/dL   Albumin 4.6 3.5 - 5.0 g/dL   AST 26 15 - 41 U/L   ALT 12 0 - 44 U/L   Alkaline Phosphatase 49 38 - 126 U/L   Total Bilirubin 0.8 0.3 - 1.2 mg/dL   GFR, Estimated >60 >60 mL/min    Comment: (NOTE) Calculated using the CKD-EPI Creatinine Equation (2021)    Anion gap 13 5 - 15    Comment: Performed at [facility], [place] [address] [place], [place] [address]  I-Stat Beta hCG blood, ED ([facility], [facility], [facility] only)     Status: None   Collection Time: 09/08/21  4:57 AM  Result Value Ref Range   I-stat hCG, quantitative <5.0 <5 mIU/mL   Comment 3            Comment:   GEST. AGE      CONC.  (mIU/mL)   <=1 WEEK        5 - 50     2 WEEKS       50 - 500     3 WEEKS       100 - 10,000     4 WEEKS     1,000 - 30,000        FEMALE AND NON-PREGNANT FEMALE:     LESS THAN 5 mIU/mL   Lithium level     Status: Abnormal   Collection Time: 09/08/21  8:01 AM  Result Value Ref Range   Lithium Lvl <0.06 (L) 0.60 - 1.20 mmol/L    Comment: Performed at [facility], [place] [address] [place], [place] [address]    Current Facility-Administered Medications  Medication Dose Route Frequency Provider Last Rate Last Admin   haloperidol (HALDOL) tablet 5 mg  5 mg Oral BID Derrill Center, NP       OLANZapine zydis (ZYPREXA) disintegrating tablet 10 mg  10 mg Oral Q8H PRN Lucrezia Starch, MD   10 mg at 09/08/21 1351   And   LORazepam (ATIVAN) tablet  1 mg  1 mg Oral PRN Lucrezia Starch, MD       And   ziprasidone (GEODON) injection 20 mg  20 mg Intramuscular PRN Lucrezia Starch, MD       Current  Outpatient Medications  Medication Sig Dispense Refill   hydrOXYzine (VISTARIL) 50 MG capsule Take 1 capsule (50 mg total) by mouth 3 (three) times daily as needed for anxiety. (Patient taking differently: Take 50 mg by mouth as needed for anxiety.) 30 capsule 0   benztropine (COGENTIN) 0.5 MG tablet Take 1 tablet (0.5 mg total) by mouth 2 (two) times daily. (Patient not taking: Reported on 09/08/2021) 60 tablet 0   haloperidol (HALDOL) 10 MG tablet Take 1 tablet (10 mg total) by mouth at bedtime. (Patient not taking: Reported on 09/08/2021) 30 tablet 0   haloperidol (HALDOL) 5 MG tablet Take 1 tablet (5 mg total) by mouth daily. (Patient not taking: Reported on 09/08/2021) 30 tablet 0   lithium carbonate (LITHOBID) 300 MG CR tablet Take 1 tablet (300 mg total) by mouth every 12 (twelve) hours. (Patient not taking: Reported on 09/08/2021) 60 tablet 0   propranolol (INDERAL) 10 MG tablet Take 1 tablet (10 mg total) by mouth 2 (two) times daily. (Patient not taking: Reported on 09/08/2021) 60 tablet 0    Musculoskeletal: Strength & Muscle Tone: within normal limits Gait & Station: normal Patient leans: N/A            Psychiatric Specialty Exam:  Presentation  General Appearance: Appropriate for Environment  Eye Contact:Good  Speech:Clear and Coherent  Speech Volume:Normal  Handedness:Right   Mood and Affect  Mood:Anxious  Affect:Congruent   Thought Process  Thought Processes:Coherent  Descriptions of Associations:Intact  Orientation:Full (Time, Place and Person)  Thought Content:Logical  History of Schizophrenia/Schizoaffective disorder:-- (UTA)  Duration of Psychotic Symptoms:Less than six months  Hallucinations:No data recorded Ideas of Reference:None  Suicidal Thoughts:No data recorded Homicidal Thoughts:No data recorded  Sensorium  Memory:Immediate Good; Recent Good; Remote Good  Judgment:Good  Insight:Good   Executive Functions   Concentration:Good  Attention Span:Good  [place]  Language:Good   Psychomotor Activity  Psychomotor Activity:No data recorded  Assets  Assets:Communication Skills; Desire for Improvement; Social Support   Sleep  Sleep:No data recorded  Physical Exam: Physical Exam Vitals and nursing note reviewed.  Cardiovascular:     Rate and Rhythm: Normal rate and regular rhythm.  Neurological:     Mental Status: She is alert.  Psychiatric:        Mood and Affect: Mood normal.        Thought Content: Thought content normal.   Review of Systems  HENT: Negative.    Eyes: Negative.   Cardiovascular: Negative.   Musculoskeletal: Negative.   Endo/Heme/Allergies: Negative.   Psychiatric/Behavioral:  Positive for hallucinations and substance abuse. Negative for suicidal ideas. The patient is nervous/anxious.   All other systems reviewed and are negative. Blood pressure 118/67, pulse 96, temperature 99.3 F (37.4 C), temperature source Oral, resp. rate 18, height 5\' 6"  (1.676 m), weight 61.2 kg, SpO2 100 %. Body mass index is 21.79 kg/m.  Treatment Plan Summary: Daily contact with patient to assess and evaluate symptoms and progress in treatment and Medication management  Bipolar 1 disorder:  Restart Haldol 5 mg p.o. twice daily Restart Cogentin 0.5 mg p.o. twice daily EKG orders- pending results, Lithium level .<0.06 CSW to continue seeking inpatient admission  Disposition: Recommend psychiatric Inpatient admission when medically cleared.  Derrill Center, NP 09/09/2021 3:34 PM

## 2021-09-10 DIAGNOSIS — F3110 Bipolar disorder, current episode manic without psychotic features, unspecified: Secondary | ICD-10-CM

## 2021-09-10 MED ORDER — LAMOTRIGINE 25 MG PO TABS
25.0000 mg | ORAL_TABLET | Freq: Every day | ORAL | Status: DC
  Administered 2021-09-10 – 2021-09-11 (×2): 25 mg via ORAL
  Filled 2021-09-10 (×2): qty 1

## 2021-09-10 MED ORDER — LORAZEPAM 0.5 MG PO TABS: 0.5000 mg | ORAL_TABLET | Freq: Three times a day (TID) | ORAL | Status: DC | PRN

## 2021-09-10 NOTE — BH Assessment (Signed)
[place] Assessment Progress Note   Per Merlyn Lot, NP, this involuntary pt requires psychiatric hospitalization at this time.  At 12:18 Lonn Georgia calls from [facility] to report that pt has been accepted to their main campus by Dr Selinda Flavin.  They will be ready to receive pt tomorrow, 09/11/2021, after 08:00.  Duard Brady concurs with this decision.  EDP Aletta Edouard, MD and pt's nurse, Larene Beach, have been notified.  Please call report to [phone].  Pt is to be transported via [facility].  [place] Coordinator [phone]

## 2021-09-10 NOTE — ED Notes (Signed)
Pt is concerned about making it to upcoming appointments: she has a medical appointment scheduled for 09/13/21 and a neuropsychiatric for 09/17/21

## 2021-09-10 NOTE — ED Notes (Signed)
Pt asleep at this time. Will hold dinner tray.

## 2021-09-10 NOTE — ED Notes (Signed)
Attempted to obtain VS. Pt still asleep. Will re-attempt at a later time. RN aware.

## 2021-09-10 NOTE — ED Notes (Signed)
Patient accompanied by this Probation officer to shower in [place]. TCU RN informed pt that she had a room in [place], this Probation officer assisted pt to move her belongings from [place] [address] to Room 30. Patient has one belongings bag, this writer placed bag in locker #33.

## 2021-09-10 NOTE — ED Notes (Signed)
Patient in room resting quietly coloring.

## 2021-09-10 NOTE — ED Notes (Signed)
Pt has been calm and cooperative this morning. 

## 2021-09-10 NOTE — ED Notes (Signed)
Patient has been asleep. Tech went in earlier to get VS and patient did wake up. VS and medication will be given once awake

## 2021-09-10 NOTE — BH Assessment (Signed)
[facility] Assessment Progress Note   Per Ophelia Shoulder, NP, this pt requires psychiatric hospitalization at this time.  Pt presents under IVC initiated by pt's roommate and upheld by EDP Melene Plan, DO.  [facility] will not have a bed suitable for this pt's needs today.  At the direction of Nelly Rout, MD this writer has referred pt to facilities outside of the [facility] system.  The following facilities have been contacted to seek placement for this pt, with results as noted:  Beds available, information sent, decision pending: Psychologist, forensic Health [facility]  Unable to reach: Earlene Plater (left message @ 11:23) [facility] (left message @ 11:22)  At capacity: [facility]   Doylene Canning, [place] [facility] Coordinator [phone]

## 2021-09-10 NOTE — Consult Note (Signed)
Telepsych Consultation   Reason for Consult:  Psychiatric Reassessment  Referring Physician:  Dr. Deno Etienne Location of Patient:    [name] Location of Provider: Other: virtual home office  Patient Identification: Kaitlyn Cline MRN:  [id number] Principal Diagnosis: Bipolar I disorder, most recent episode (or current) manic ([place]) Diagnosis:  Principal Problem:   Bipolar I disorder, most recent episode (or current) manic ([place])   Total Time spent with patient: 30 minutes  Subjective:   Kaitlyn Cline is a 21 y.o. female patient with bipolar disorder, admitted with acute psychosis, mania in the setting of non-compliance with lithium.  Interval Progress Note 09/10/2021: Patient seen via telepsych by this provider; chart reviewed and consulted with Dr. Dwyane Dee on 09/10/21.  On evaluation Kaitlyn Cline reports she has several things she would like review with me today. She shows several sheets of paper which appear full with things she has written on them. She is hyper focused on "feeling dirty" because she was told she would have to wait a little before bathing.  Endorses anxiety and request medication to help with this but does want anything to make her sleepy.  Also reports being mistrustful of taking medications but does not know why. Based on notes, she has improved but is not at baseline.  She is impulsive and lacks good judgment.  Taking haldol 5mg  po BID helps with psychosis but needs additional medications for mania.    She works at [employer] and is afraid to lose her job, "I can function without taking medications."  Reports she has responsibilities, lives with 3 roommates and has to pay her share of bills.  She does not have family here, they all live in [place].  Denies any support system.  It is unclear why she relocated to [facility] area void of family, suspect for school although she is not currently enrolled in classes.   HPI:   Per [place] assessment 09/09/2021: Kaitlyn Cline is  a 21 year old female who presents involuntary and unaccompanied to [facility] with ongoing delusions and psychosis. Per IVC patient has been responding to internal stimuli and talking to herself along with display other bizarre behaviors. Patient is observed to be delusional/psychotic at the time of assessment and renders limited history this date. Patient reports she "doesn't want to talk to a man that is the devil." Patient is refusing to participate in the assessment and keeps repeating "please leave sir." Patient is observed to be getting increasingly agitated as this Probation officer attempts to redirect. Patient does deny any S/I, H/I or AVH although this writer is uncertain if patient is comprehending the content of this writer's questions. Information to complete assessment was obtained from admission notes and previous history. Patient was seen and admitted on 08/01/21. Assessment below was used to complete history this date  Per ED Provider Admission Assessment 09/08/2021   Psychiatric Evaluation      IVC      Kaitlyn Cline is a 21 y.o. female.   21 yo F with a chief complaints of acute agitation.  The patient has a history of bipolar disorder and reportedly has come off of her medications.  Per EMS she was walking around the house naked and was a bit agitated.  Got into an altercation with the EMS providers and required police involvement.  Was given Haldol and Ativan in route.    Past Psychiatric History: As outlined below  Risk to Self:  yes Risk to Others:  no Prior Inpatient Therapy: no  Prior Outpatient Therapy:  no  Past Medical History: History reviewed. No pertinent past medical history. History reviewed. No pertinent surgical history. Family History: History reviewed. No pertinent family history. Family Psychiatric  History: unknown Social History:  Social History   Substance and Sexual Activity  Alcohol Use Yes     Social History   Substance and Sexual Activity  Drug Use Yes    Types: Marijuana    Social History   Socioeconomic History   Marital status: Single    Spouse name: Not on file   Number of children: Not on file   Years of education: 14   Highest education level: Some college, no degree  Occupational History   Not on file  Tobacco Use   Smoking status: Never   Smokeless tobacco: Not on file  Vaping Use   Vaping Use: Unknown  Substance and Sexual Activity   Alcohol use: Yes   Drug use: Yes    Types: Marijuana   Sexual activity: Not Currently  Other Topics Concern   Not on file  Social History Narrative   Not on file   Social Determinants of Health   Financial Resource Strain: Not on file  Food Insecurity: Not on file  Transportation Needs: Not on file  Physical Activity: Not on file  Stress: Not on file  Social Connections: Not on file   Additional Social History:    Allergies:  No Known Allergies  Labs: No results found for this or any previous visit (from the past 48 hour(s)).  Medications:  Current Facility-Administered Medications  Medication Dose Route Frequency Provider Last Rate Last Admin   haloperidol (HALDOL) tablet 5 mg  5 mg Oral BID Derrill Center, NP       lamoTRIgine (LAMICTAL) tablet 25 mg  25 mg Oral Daily Merlyn Lot E, NP       LORazepam (ATIVAN) tablet 0.5 mg  0.5 mg Oral TID PRN Mallie Darting, NP       OLANZapine zydis (ZYPREXA) disintegrating tablet 10 mg  10 mg Oral Q8H PRN Lucrezia Starch, MD   10 mg at 09/09/21 1345   And   LORazepam (ATIVAN) tablet 1 mg  1 mg Oral PRN Lucrezia Starch, MD       And   ziprasidone (GEODON) injection 20 mg  20 mg Intramuscular PRN Lucrezia Starch, MD       Current Outpatient Medications  Medication Sig Dispense Refill   hydrOXYzine (VISTARIL) 50 MG capsule Take 1 capsule (50 mg total) by mouth 3 (three) times daily as needed for anxiety. (Patient taking differently: Take 50 mg by mouth as needed for anxiety.) 30 capsule 0   benztropine (COGENTIN) 0.5 MG  tablet Take 1 tablet (0.5 mg total) by mouth 2 (two) times daily. (Patient not taking: Reported on 09/08/2021) 60 tablet 0   haloperidol (HALDOL) 10 MG tablet Take 1 tablet (10 mg total) by mouth at bedtime. (Patient not taking: Reported on 09/08/2021) 30 tablet 0   haloperidol (HALDOL) 5 MG tablet Take 1 tablet (5 mg total) by mouth daily. (Patient not taking: Reported on 09/08/2021) 30 tablet 0   lithium carbonate (LITHOBID) 300 MG CR tablet Take 1 tablet (300 mg total) by mouth every 12 (twelve) hours. (Patient not taking: Reported on 09/08/2021) 60 tablet 0   propranolol (INDERAL) 10 MG tablet Take 1 tablet (10 mg total) by mouth 2 (two) times daily. (Patient not taking: Reported on 09/08/2021) 60 tablet 0  Musculoskeletal: Strength & Muscle Tone: within normal limits Gait & Station: normal Patient leans: N/A   Psychiatric Specialty Exam:  Presentation  General Appearance: Appropriate for Environment  Eye Contact:Good  Speech:Pressured; Clear and Coherent  Speech Volume:Normal  Handedness:Right   Mood and Affect  Mood:Anxious; Dysphoric  Affect:Constricted; Congruent   Thought Process  Thought Processes:Irrevelant  Descriptions of Associations:Circumstantial  Orientation:Partial  Thought Content:Illogical; Rumination  History of Schizophrenia/Schizoaffective disorder:No  Duration of Psychotic Symptoms:N/A  Hallucinations:Hallucinations: None  Ideas of Reference:None  Suicidal Thoughts:Suicidal Thoughts: No  Homicidal Thoughts:Homicidal Thoughts: No   Sensorium  Memory:Immediate Good; Recent Good; Remote Good  Judgment:Impaired  Insight:Lacking   Executive Functions  Concentration:Fair  Attention Span:Fair  Recall:Good  Fund of Knowledge:Good  Language:Good   Psychomotor Activity  Psychomotor Activity:Psychomotor Activity: Normal   Assets  Assets:Communication Skills; Financial Resources/Insurance; Housing   Sleep  Sleep:Sleep:  Fair Number of Hours of Sleep: 5   Physical Exam: Physical Exam Cardiovascular:     Rate and Rhythm: Normal rate.     Pulses: Normal pulses.  Pulmonary:     Effort: Pulmonary effort is normal.  Musculoskeletal:        General: Normal range of motion.     Cervical back: Normal range of motion.  Neurological:     Mental Status: She is alert.  Psychiatric:        Attention and Perception: Attention and perception normal.        Mood and Affect: Mood is anxious. Affect is labile and tearful.        Speech: Speech is rapid and pressured.        Behavior: Behavior is cooperative.        Thought Content: Thought content is paranoid. Thought content is not delusional. Thought content does not include homicidal or suicidal ideation. Thought content does not include homicidal or suicidal plan.        Cognition and Memory: Memory normal. Cognition is impaired.        Judgment: Judgment is impulsive and inappropriate.   ROS Blood pressure (!) 116/51, pulse 76, temperature 98.2 F (36.8 C), temperature source Oral, resp. rate 18, height 5\' 6"  (1.676 m), weight 61.2 kg, SpO2 100 %. Body mass index is 21.79 kg/m.  Treatment Plan Summary: Based on above, she has improved but not at baseline.  She is impulsive and lacks good judgment.  Taking haldol 5mg  po BID helps with psychosis but needs additional medications for manic symptoms.    In discharge planning, discussed TOC consults for linking patient with ACTT; Per Jalene Mullet, LCSW she has private insurance that may preclude approval. Once stabilized, she may benefit from [facility] for additional community support.   Medications: Continue the following: Haldol 5mg  po BID for psychosis  Add Lorazepam 0.5mg  po TID prn anxiety, EPS Lamotrigne 25mg  po daily for manic symptoms  Disposition: Recommend psychiatric Inpatient admission when medically cleared.  This service was provided via telemedicine using a 2-way, interactive audio and video  technology.  Names of all persons participating in this telemedicine service and their role in this encounter. Name: [name] Role: Patient  Name: Merlyn Lot Role: PMHNP    Mallie Darting, NP 09/10/2021 10:10 AM

## 2021-09-11 NOTE — ED Notes (Signed)
Patient DC d off unit to facility per provider.  Patient alert, calm, cooperative, no s/s  of distress. DC information and belongings given to sheriff for transport. Patient ambulatory off unit, escorted and transported by [employer]

## 2021-09-18 ENCOUNTER — Telehealth (HOSPITAL_COMMUNITY): Payer: Self-pay

## 2021-09-18 NOTE — BH Assessment (Signed)
Care Management - Follow Up [facility] Discharges   Patient has been placed in an inpatient psychiatric hospital [facility]) on 09-11-2021

## 2021-11-17 ENCOUNTER — Ambulatory Visit (HOSPITAL_COMMUNITY)
Admission: EM | Admit: 2021-11-17 | Discharge: 2021-11-17 | Disposition: A | Discharge status: Home / Self Care | Payer: Commercial Managed Care - PPO | Attending: Family | Admitting: Family

## 2021-11-17 DIAGNOSIS — F5102 Adjustment insomnia: Secondary | ICD-10-CM

## 2021-11-17 DIAGNOSIS — F319 Bipolar disorder, unspecified: Secondary | ICD-10-CM | POA: Diagnosis not present

## 2021-11-17 MED ORDER — TRAZODONE HCL 50 MG PO TABS: 50.0000 mg | ORAL_TABLET | Freq: Every evening | ORAL | 0 refills | Status: DC | PRN

## 2021-11-17 MED ORDER — TRAZODONE HCL 50 MG PO TABS: 50.0000 mg | ORAL_TABLET | Freq: Every evening | ORAL | Status: DC | PRN

## 2021-11-17 NOTE — Discharge Summary (Signed)
Kaitlyn Cline to be D/C'd Home per NP order.   ?An After Visit Summary was printed and given to the patient by the provider. Patient escorted out and D/C home via private auto.  ?Allecia Bells  Kathlen Brunswick  ?11/17/2021 10:29 AM ?  ?   ?

## 2021-11-17 NOTE — ED Provider Notes (Addendum)
Behavioral Health Urgent Care Medical Screening Exam ? ?Patient Name: Kaitlyn Cline ?MRN: [id number] ?Date of Evaluation: 11/17/21 ?Chief Complaint:   " I cant sleep."  ?Diagnosis:  ?Final diagnoses:  ?Adjustment insomnia  ?Bipolar I disorder (HCC)  ? ? ?History of Present illness: Kaitlyn Cline is a 21 y.o. female.  Presents to [facility] urgent care reporting insomnia.  States she has been out for the past 4 days.  She denies suicidal or homicidal ideations.  Denies auditory visual hallucinations.  States she was recently discharged from [facility] from inpatient admission.  Due to his psychosis, hallucinations.  She reports a history of bipolar 1 disorder, major depressive disorder and generalized anxiety disorder.  States she was initiated on Depakote and Aristada however states she has not been taking Depakote as indicated.  ? ?Kaitlyn Cline reports she is has some anxiety related to moving back home to [place].  She is a Holiday representative at [employer]  Stated that she was fired for [employer] and while she was hospitalized.  States her family requested she go back home for a while. ? ?Discussed taking medications as indicated.  We will make trazodone 10 tablets available to assist with sleep.  She reports follow-up appointment with primary care provider and psychiatrist April 4th.  Support, encouragement and  reassurance was provided. ? ?During evaluation Kaitlyn Cline is sitting  in no acute distress. She is alert/oriented x 4; calm/cooperative; and mood congruent with affect. She is speaking in a clear tone at moderate volume, and normal pace; with good eye contact. Her thought process is coherent and relevant; There is no indication that she is currently responding to internal/external stimuli or experiencing delusional thought content; and she has denied suicidal/self-harm/homicidal ideation, psychosis, and paranoia.  Patient has remained calm throughout assessment and has answered questions appropriately.    ? ? ?At this time Kaitlyn Cline is educated and verbalizes understanding of mental health resources and other crisis services in the community. She is instructed to call 911 and present to the nearest emergency room should she experience any suicidal/homicidal ideation, auditory/visual/hallucinations, or detrimental worsening of her mental health condition. She was a also advised by Clinical research associate that she could call the toll-free phone on insurance card to assist with identifying in network counselors and agencies or number on back of Medicaid card to speak with care coordinator ?  ? ? ? ? ?Psychiatric Specialty Exam ? ?Presentation  ?General Appearance:Appropriate for Environment ? ?Eye Contact:Good ? ?Speech:Clear and Coherent ? ?Speech Volume:Normal ? ?Handedness:Left ? ? ?Mood and Affect  ?Mood:Anxious ? ?Affect:Congruent ? ? ?Thought Process  ?Thought Processes:Coherent ? ?Descriptions of Associations:Intact ? ?Orientation:Full (Time, Place and Person) ? ?Thought Content:Logical ? Diagnosis of Schizophrenia or Schizoaffective disorder in past: No ?  Hallucinations:None ? ?Ideas of Reference:None ? ?Suicidal Thoughts:No ? ?Homicidal Thoughts:No ? ? ?Sensorium  ?Memory:Immediate Good; Recent Good; Remote Good ? ?Judgment:Fair ? ?Insight:Fair ? ? ?Executive Functions  ?Concentration:Fair ? ?Attention Span:Good ? ?Recall:Good ? ?Fund of Knowledge:Good ? ?Language:Good ? ? ?Psychomotor Activity  ?Psychomotor Activity:Normal ? ? ?Assets  ?Assets:Housing; Desire for Improvement ? ? ?Sleep  ?Sleep:Fair ? ?Number of hours: 5 ? ? ?Nutritional Assessment (For OBS and FBC admissions only) ?Has the patient had a weight loss or gain of 10 pounds or more in the last 3 months?: No ?Has the patient had a decrease in food intake/or appetite?: No ?Does the patient have dental problems?: No ?Does the patient have eating habits or behaviors that may be  indicators of an eating disorder including binging or inducing vomiting?: No ?Has the  patient recently lost weight without trying?: 0 ?Has the patient been eating poorly because of a decreased appetite?: 0 ?Malnutrition Screening Tool Score: 0 ? ? ? ?Physical Exam: ?Physical Exam ?Vitals reviewed.  ?Cardiovascular:  ?   Rate and Rhythm: Normal rate and regular rhythm.  ?Abdominal:  ?   General: Abdomen is flat.  ?Neurological:  ?   Mental Status: She is alert and oriented to person, place, and time.  ?Psychiatric:     ?   Mood and Affect: Mood normal.     ?   Thought Content: Thought content normal.  ? ?Review of Systems  ?Eyes: Negative.   ?Cardiovascular: Negative.   ?Musculoskeletal: Negative.   ?Skin: Negative.   ?Psychiatric/Behavioral:  Negative for depression and suicidal ideas. The patient is nervous/anxious and has insomnia.   ?All other systems reviewed and are negative. ?Blood pressure (!) 126/91, pulse 89, temperature 99.1 ?F (37.3 ?C), temperature source Oral, resp. rate 19, SpO2 98 %. There is no height or weight on file to calculate BMI. ? ?Musculoskeletal: ?Strength & Muscle Tone: within normal limits ?Gait & Station: normal ?Patient leans: N/A ? ?- will make Trazodone 50 mg x 10 tablets  ?- keep all outpatient follow-up apponiments ? ?[facility] MSE Discharge Disposition for Follow up and Recommendations: ?Based on my evaluation the patient does not appear to have an emergency medical condition and can be discharged with resources and follow up care in outpatient services for Medication Management ? ? ?Oneta Rack, NP ?11/17/2021, 10:40 AM ? ?

## 2021-11-17 NOTE — Progress Notes (Signed)
?   11/17/21 1019  ?[facility] Triage Screening (Walk-ins at [facility] only)  ?How Did You Hear About [place]? Self  ?What Is the Reason for Your Visit/Call Today? Pt is a 21 yo female who presented due to being prescribed new medications and not sleeping for 4 days. Pt denied SI, HI, NSSH, AVH, paranoia and substance use in the last 24 hours. Pt stated she is not taking the Depakote prescribed toi her. Per pt, hx of Bipolar 1 d/o.  ?How Long Has This Been Causing You Problems? 1 wk - 1 month  ?Have You Recently Had Any Thoughts About Hurting Yourself? No  ?Are You Planning to Commit Suicide/Harm Yourself At This time? No  ?Have you Recently Had Thoughts About Hurting Someone Karolee Ohs? No  ?Are You Planning To Harm Someone At This Time? No  ?Are you currently experiencing any auditory, visual or other hallucinations? No  ?Have You Used Any Alcohol or Drugs in the Past 24 Hours? No  ?Do you have any current medical co-morbidities that require immediate attention? No  ?Clinician description of patient physical appearance/behavior: Pt was calm, cooperative, alert and seemed fully oriented. Pt was casually dressed and seemed adequately groomed. Pt's mood seemed a bit anxious and her flat affect seemed congruent. Pt's judgment and insight seemed good.  ?What Do You Feel Would Help You the Most Today? Medication(s)  ?If access to [facility] was not available, would you have sought care in the Emergency Department? No  ?Determination of Need Routine (7 days) ?(Per Hillery Jacks NP, pt is psychiatrically cleared.)  ?Options For Referral Medication Management  ? ?Chrystine Frogge T. Jimmye Norman, MS, [facility] ?Triage Specialist ?[place] ? ?

## 2021-11-17 NOTE — Discharge Instructions (Addendum)
Take all medications as prescribed. Keep all follow-up appointments as scheduled.  Do not consume alcohol or use illegal drugs while on prescription medications. Report any adverse effects from your medications to your primary care provider promptly.  In the event of recurrent symptoms or worsening symptoms, call 911, a crisis hotline, or go to the nearest emergency department for evaluation.   

## 2021-11-18 ENCOUNTER — Ambulatory Visit (INDEPENDENT_AMBULATORY_CARE_PROVIDER_SITE_OTHER)
Admission: EM | Admit: 2021-11-18 | Discharge: 2021-11-20 | Disposition: A | Discharge status: Other Acute Inpatient Hospital | Payer: Commercial Managed Care - PPO | Source: Home / Self Care

## 2021-11-18 DIAGNOSIS — Z20822 Contact with and (suspected) exposure to covid-19: Secondary | ICD-10-CM | POA: Insufficient documentation

## 2021-11-18 DIAGNOSIS — F3130 Bipolar disorder, current episode depressed, mild or moderate severity, unspecified: Secondary | ICD-10-CM | POA: Diagnosis not present

## 2021-11-18 DIAGNOSIS — G4700 Insomnia, unspecified: Secondary | ICD-10-CM | POA: Insufficient documentation

## 2021-11-18 DIAGNOSIS — F319 Bipolar disorder, unspecified: Secondary | ICD-10-CM | POA: Insufficient documentation

## 2021-11-18 MED ORDER — TRAZODONE HCL 50 MG PO TABS
50.0000 mg | ORAL_TABLET | Freq: Every evening | ORAL | Status: DC | PRN
  Administered 2021-11-19 (×2): 50 mg via ORAL
  Filled 2021-11-18 (×2): qty 1

## 2021-11-18 MED ORDER — HYDROXYZINE HCL 10 MG PO TABS
10.0000 mg | ORAL_TABLET | Freq: Three times a day (TID) | ORAL | Status: DC | PRN
  Administered 2021-11-19: 10 mg via ORAL
  Filled 2021-11-18 (×2): qty 1

## 2021-11-18 MED ORDER — ALUM & MAG HYDROXIDE-SIMETH 200-200-20 MG/5ML PO SUSP: 30.0000 mL | ORAL | Status: DC | PRN

## 2021-11-18 MED ORDER — ACETAMINOPHEN 325 MG PO TABS: 650.0000 mg | ORAL_TABLET | Freq: Four times a day (QID) | ORAL | Status: DC | PRN

## 2021-11-18 MED ORDER — MAGNESIUM HYDROXIDE 400 MG/5ML PO SUSP: 30.0000 mL | Freq: Every day | ORAL | Status: DC | PRN
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
## 2021-11-18 NOTE — ED Provider Notes (Addendum)
Behavioral Health Admission H&P ?(FBC & OBS) ? ?Date: 11/19/21 ?Patient Name: Kaitlyn Cline ?MRN: [id number] ?Chief Complaint:  ?Chief Complaint  ?Patient presents with  ? Anxiety  ? Schizophrenia  ? Insomnia  ?   ? ?Diagnoses:  ?Final diagnoses:  ?Bipolar I disorder, most recent episode depressed (HCC)  ? ? ?HPI: Kaitlyn Cline is a 21 year old female with psychiatric history of bipolar 1 disorder and anxiety.  Patient presented voluntarily to [facility] with chief complaint of difficulty sleeping.  Patient was seen here at [facility] on 11/17/21 with the same complaint and started on Trazodone 50 mg prn at night for sleep. ? ?Patient was seen face-to-face and her chart was reviewed by this nurse petitioner.  On assessment, patient is alert and oriented x4; she is calm and cooperative.  She is speaking in a normal tone of voice at moderate rate with good eye contact. patient's mood is anxious with congruent affect and her thought process is coherent. ? ?Patient reports that she is very anxious and has being unable to sleep over the past 6 days.  She reports that she is planning on moving to her hometown in [place]  this coming Tuesday 11/20/21 due to mental health and for family support.  She reports that she was recently hospitalized at [facility] due to Bipolar mania.  She states she was started on Aristada injection every 8 weeks and Depakote 500 mg twice daily.  Patient reports that she has not been taking her Depakote as prescribed because she felt she did not need it and thought that Aristada injection was sufficient to treat her Bipolar.  She reports that she is very anxious about moving back home and not being able to receive her injection on time.  ?Patient denies suicidal ideation, homicidal ideation, visual/auditory hallucinations,  paranoia, and substance abuse.  Patient reports that she is experiencing olfactory hallucinations of "assessment lithium, and it makes me smell like trash.  I am  smelling myself and it makes me want to gag."  Patient reports that she takes about 3 shots to day due to the smell.  ? ?PHQ 2-9:  ?Flowsheet Row ED from 11/18/2021 in [facility]  ?Thoughts that you would be better off dead, or of hurting yourself in some way Not at all  ?PHQ-9 Total Score 12  ? ?  ?  ?Flowsheet Row ED from 11/18/2021 in [facility] ED from 09/08/2021 [facility] (Discharged[facility]  ?C-SSRS RISK CATEGORY No Risk No Risk No Risk  ? ?  ?  ? ?Total Time spent with patient: 20 minutes ? ?Musculoskeletal  ?Strength & Muscle Tone: within normal limits ?Gait & Station: normal ?Patient leans: Right ? ?Psychiatric Specialty Exam  ?Presentation ?General Appearance: Appropriate for Environment ? ?Eye Contact:Good ? ?Speech:Clear and Coherent ? ?Speech Volume:Normal ? ?Handedness:Right ? ? ?Mood and Affect  ?Mood:Anxious ? ?Affect:Congruent ? ? ?Thought Process  ?Thought Processes:Coherent ? ?Descriptions of Associations:Intact ? ?Orientation:Full (Time, Place and Person) ? ?Thought Content:WDL ? Diagnosis of Schizophrenia or Schizoaffective disorder in past: Yes ?  ?Hallucinations:Hallucinations: Olfactory ("smelling myself and it make me wanna gag") ? ?Ideas of Reference:None ? ?Suicidal Thoughts:Suicidal Thoughts: No ? ?Homicidal Thoughts:Homicidal Thoughts: No ? ? ?Sensorium  ?Memory:Immediate Good; Remote Good; Recent Good ? ?Judgment:Fair ? ?Insight:Good ? ? ?Executive Functions  ?Concentration:Good ? ?Attention Span:Good ? ?Recall:Good ? ?Fund of Knowledge:Good ? ?Language:Good ? ? ?  Psychomotor Activity  ?Psychomotor Activity:Psychomotor Activity: Normal ? ? ?Assets  ?Assets:Communication Skills; Desire for Improvement; Housing; Social Support; Physical Health ? ? ?Sleep  ?Sleep:Sleep: Poor ?Number of Hours of Sleep: 1 ? ? ?Nutritional Assessment  (For OBS and FBC admissions only) ?Has the patient had a weight loss or gain of 10 pounds or more in the last 3 months?: No ?Has the patient had a decrease in food intake/or appetite?: No ?Does the patient have dental problems?: No ?Does the patient have eating habits or behaviors that may be indicators of an eating disorder including binging or inducing vomiting?: No ?Has the patient recently lost weight without trying?: 0 ?Has the patient been eating poorly because of a decreased appetite?: 0 ?Malnutrition Screening Tool Score: 0 ? ? ? ?Physical Exam ?Vitals and nursing note reviewed.  ?Constitutional:   ?   General: She is not in acute distress. ?   Appearance: She is well-developed.  ?HENT:  ?   Head: Normocephalic and atraumatic.  ?Eyes:  ?   Conjunctiva/sclera: Conjunctivae normal.  ?Cardiovascular:  ?   Rate and Rhythm: Normal rate.  ?Pulmonary:  ?   Effort: Pulmonary effort is normal. No respiratory distress.  ?Abdominal:  ?   Palpations: Abdomen is soft.  ?   Tenderness: There is no abdominal tenderness.  ?Musculoskeletal:     ?   General: No swelling.  ?   Cervical back: Neck supple.  ?Skin: ?   General: Skin is warm and dry.  ?Neurological:  ?   Mental Status: She is alert and oriented to person, place, and time.  ?Psychiatric:     ?   Attention and Perception: Attention and perception normal. She does not perceive auditory or visual hallucinations.     ?   Mood and Affect: Mood is anxious.     ?   Speech: Speech normal.     ?   Behavior: Behavior normal. Behavior is cooperative.     ?   Thought Content: Thought content normal.     ?   Cognition and Memory: Cognition normal.  ? ?Review of Systems  ?Constitutional: Negative.   ?HENT: Negative.    ?Eyes: Negative.   ?Respiratory: Negative.    ?Cardiovascular: Negative.   ?Gastrointestinal: Negative.   ?Genitourinary: Negative.   ?Musculoskeletal: Negative.   ?Skin: Negative.   ?Neurological: Negative.   ?Endo/Heme/Allergies: Negative.    ?Psychiatric/Behavioral:  Positive for hallucinations (olafactory hallucination). The patient is nervous/anxious.   ? ?Blood pressure 117/79, pulse (!) 139, temperature 99.4 ?F (37.4 ?C), temperature source Oral, resp. rate 16, SpO2 100 %. There is no height or weight on file to calculate BMI. ? ?Past Psychiatric History: Bipolar 1, anxiety ? ?Is the patient at risk to self? No  ?Has the patient been a risk to self in the past 6 months? No .    ?Has the patient been a risk to self within the distant past? Yes   ?Is the patient a risk to others? No   ?Has the patient been a risk to others in the past 6 months? No   ?Has the patient been a risk to others within the distant past? No  ? ?Past Medical History: No past medical history on file. No past surgical history on file. ? ?Family History: No family history on file. ? ?Social History:  ?Social History  ? ?Socioeconomic History  ? Marital status: Single  ?  Spouse name: Not on file  ? Number of children: Not on  file  ? Years of education: 7914  ? Highest education level: Some college, no degree  ?Occupational History  ? Not on file  ?Tobacco Use  ? Smoking status: Never  ? Smokeless tobacco: Not on file  ?Vaping Use  ? Vaping Use: Unknown  ?Substance and Sexual Activity  ? Alcohol use: Yes  ? Drug use: Yes  ?  Types: Marijuana  ? Sexual activity: Not Currently  ?Other Topics Concern  ? Not on file  ?Social History Narrative  ? Not on file  ? ?Social Determinants of Health  ? ?Financial Resource Strain: Not on file  ?Food Insecurity: Not on file  ?Transportation Needs: Not on file  ?Physical Activity: Not on file  ?Stress: Not on file  ?Social Connections: Not on file  ?Intimate Partner Violence: Not on file  ? ? ?SDOH:  ?SDOH Screenings  ? ?Alcohol Screen: Not on file  ?Depression (PHQ2-9): Medium Risk  ? PHQ-2 Score: 12  ?Financial Resource Strain: Not on file  ?Food Insecurity: Not on file  ?Housing: Not on file  ?Physical Activity: Not on file  ?Social Connections:  Not on file  ?Stress: Not on file  ?Tobacco Use: Unknown  ? Smoking Tobacco Use: Never  ? Smokeless Tobacco Use: Unknown  ? Passive Exposure: Not on file  ?Transportation Needs: Not on file  ? ? ?Last Labs:  ?Admission on 0
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
## 2021-11-19 LAB — CBC WITH DIFFERENTIAL/PLATELET
Abs Immature Granulocytes: 0.01 10*3/uL (ref 0.00–0.07)
Basophils Absolute: 0 10*3/uL (ref 0.0–0.1)
Basophils Relative: 1 %
Eosinophils Absolute: 0.1 10*3/uL (ref 0.0–0.5)
Eosinophils Relative: 2 %
HCT: 40.7 % (ref 36.0–46.0)
Hemoglobin: 13.5 g/dL (ref 12.0–15.0)
Immature Granulocytes: 0 %
Lymphocytes Relative: 43 %
Lymphs Abs: 2.8 10*3/uL (ref 0.7–4.0)
MCH: 31.6 pg (ref 26.0–34.0)
MCHC: 33.2 g/dL (ref 30.0–36.0)
MCV: 95.3 fL (ref 80.0–100.0)
Monocytes Absolute: 0.6 10*3/uL (ref 0.1–1.0)
Monocytes Relative: 9 %
Neutro Abs: 3 10*3/uL (ref 1.7–7.7)
Neutrophils Relative %: 45 %
Platelets: 244 10*3/uL (ref 150–400)
RBC: 4.27 MIL/uL (ref 3.87–5.11)
RDW: 13.2 % (ref 11.5–15.5)
WBC: 6.4 10*3/uL (ref 4.0–10.5)
nRBC: 0 % (ref 0.0–0.2)

## 2021-11-19 LAB — LIPID PANEL
Cholesterol: 213 mg/dL — ABNORMAL HIGH (ref 0–200)
HDL: >135 mg/dL (ref >40)
Triglycerides: 24 mg/dL (ref <150)
VLDL: 5 mg/dL (ref 0–40)

## 2021-11-19 LAB — POC SARS CORONAVIRUS 2 AG: SARSCOV2ONAVIRUS 2 AG: NEGATIVE

## 2021-11-19 LAB — POCT URINE DRUG SCREEN - MANUAL ENTRY (I-SCREEN)
POC Amphetamine UR: NOT DETECTED
POC Buprenorphine (BUP): NOT DETECTED
POC Cocaine UR: NOT DETECTED
POC Marijuana UR: NOT DETECTED
POC Methadone UR: NOT DETECTED
POC Methamphetamine UR: NOT DETECTED
POC Morphine: NOT DETECTED
POC Oxazepam (BZO): NOT DETECTED
POC Oxycodone UR: NOT DETECTED
POC Secobarbital (BAR): NOT DETECTED

## 2021-11-19 LAB — VALPROIC ACID LEVEL: Valproic Acid Lvl: <10 ug/mL — ABNORMAL LOW (ref 50.0–100.0)

## 2021-11-19 LAB — COMPREHENSIVE METABOLIC PANEL
ALT: 12 U/L (ref 0–44)
AST: 15 U/L (ref 15–41)
Albumin: 4.5 g/dL (ref 3.5–5.0)
Alkaline Phosphatase: 46 U/L (ref 38–126)
Anion gap: 11 (ref 5–15)
BUN: 14 mg/dL (ref 6–20)
CO2: 25 mmol/L (ref 22–32)
Calcium: 10 mg/dL (ref 8.9–10.3)
Chloride: 102 mmol/L (ref 98–111)
Creatinine, Ser: 0.82 mg/dL (ref 0.44–1.00)
GFR, Estimated: >60 mL/min (ref >60)
Glucose, Bld: 99 mg/dL (ref 70–99)
Potassium: 3.6 mmol/L (ref 3.5–5.1)
Sodium: 138 mmol/L (ref 135–145)
Total Bilirubin: 1 mg/dL (ref 0.3–1.2)
Total Protein: 8 g/dL (ref 6.5–8.1)

## 2021-11-19 LAB — RESP PANEL BY RT-PCR (FLU A&B, COVID) ARPGX2
Influenza A by PCR: NEGATIVE
Influenza B by PCR: NEGATIVE
SARS Coronavirus 2 by RT PCR: NEGATIVE

## 2021-11-19 LAB — POCT PREGNANCY, URINE: Preg Test, Ur: NEGATIVE

## 2021-11-19 LAB — POC SARS CORONAVIRUS 2 AG -  ED: SARS Coronavirus 2 Ag: NEGATIVE

## 2021-11-19 MED ORDER — LORAZEPAM 1 MG PO TABS
1.0000 mg | ORAL_TABLET | Freq: Every evening | ORAL | Status: DC | PRN
  Administered 2021-11-19: 1 mg via ORAL
  Filled 2021-11-19: qty 1

## 2021-11-19 MED ORDER — DIVALPROEX SODIUM 500 MG PO DR TAB
500.0000 mg | DELAYED_RELEASE_TABLET | Freq: Two times a day (BID) | ORAL | Status: DC
  Administered 2021-11-19 – 2021-11-20 (×4): 500 mg via ORAL
  Filled 2021-11-19 (×4): qty 1

## 2021-11-19 MED ORDER — LORAZEPAM 1 MG PO TABS
2.0000 mg | ORAL_TABLET | Freq: Once | ORAL | Status: AC
  Administered 2021-11-19: 2 mg via ORAL
  Filled 2021-11-19: qty 2

## 2021-11-19 MED ORDER — TRAZODONE HCL 50 MG PO TABS: 50.0000 mg | ORAL_TABLET | Freq: Once | ORAL | Status: DC | PRN

## 2021-11-19 NOTE — ED Notes (Addendum)
Pt presents with anxiety and insomnia for the past 6 days. Pt reports she lost her job due to her mental  health history.  Denies SI, HI or AVH.  Pt cooperative and interactive with staff.  Comfort measures given.  Monitoring for safety. ?

## 2021-11-19 NOTE — Progress Notes (Signed)
Patient taking shower at this time, hygiene products given to patient. ?

## 2021-11-19 NOTE — Progress Notes (Signed)
Patient awake playing cards alone. Lunch offered. Staff will continue to monitor. ?

## 2021-11-19 NOTE — Discharge Instructions (Addendum)
Transfer to [facility] for IP psychiatric admission  ? ? ?Take all medications as prescribed. ?Keep all follow-up appointments as scheduled.  ?Do not consume alcohol or use illegal drugs while on prescription medications. ?Report any adverse effects from your medications to your primary care provider promptly.  ?In the event of recurrent symptoms or worsening symptoms, call 911, a crisis hotline, or go to the nearest emergency department for evaluation.   ?

## 2021-11-19 NOTE — Progress Notes (Signed)
Patient agreed to stay one more night with the intent of being discharged home tomorrow morning.  Instead of going to [facility]. ?

## 2021-11-19 NOTE — ED Notes (Signed)
Pt singing loudly on unit. Has been asked to keep it down due to other pt on unit. She asked pt is it ok if she sings and pt said she does not care. ?

## 2021-11-19 NOTE — ED Notes (Signed)
Pt sleeping at present, resting comfortably.  No distress noted.  Monitoring for safety. ?

## 2021-11-19 NOTE — ED Notes (Signed)
Pt was given a muffin and juice for breakfast.  

## 2021-11-19 NOTE — Progress Notes (Signed)
Patient having moments of loud outburst,  nonsensical, pressured speech while laying down with no one talking to her. Patient not having self harming behaviors. Nursing staff will continue to monitor. ?

## 2021-11-19 NOTE — ED Notes (Signed)
Pt A&O x 4, resting at present, no distress noted. Interactive with staff.  Cooperative at present.  Monitoring for safety. ?

## 2021-11-19 NOTE — Progress Notes (Signed)
Patient received Ativan 2 mg PO for restlessness/ agitation.  Staff will continue to monitor. ?

## 2021-11-19 NOTE — ED Notes (Signed)
Pt sleeping@this time. Breathing even and unlabored. Will continue to monitor for safety 

## 2021-11-19 NOTE — Progress Notes (Signed)
RN informed patient of acceptance to [facility]. Patient states she would like to go home, and she has someone to pick her up. RN notified provider. ?

## 2021-11-19 NOTE — Progress Notes (Signed)
Patient has stopped singing loudly on the unit. Patient informed RN, "My family always bugging me about taking medication, but I know I don't need the medication." RN encouraged patient to stay on medication once discharge, and to set an alarm on her telephone to help manage medication. Patient verbalized understanding. Nursing staff will continue to monitor. ?

## 2021-11-19 NOTE — ED Provider Notes (Addendum)
8:30 patient observed singing loudly and dancing throughout the milieu, she presents pressured hyperverbal, elevated and expansive. It was reported that she is on day 6 without sleep. Patient was recently discharge from inpatient admission, however  she reported that she doesn't like taking medications. Orders placed for Ativan. ? ?NP spoke to family member. Consepcion Hearing at [phone] who she reported that she is going to reside with [place].  ? ?Family is okay with inpatient admission for mood stabilization.- restarted Depakote 500 mg daily. Inpatient admission is recommended. It was reported that patient's behavior has improved since agitation medications was provided.  ? ?15:30-patient was accepted for [facility] behavioral health under Dr. Alcide Clever services however she declined voluntary admission.  Discussed overnight observation for medication management/stabilization with consideration for discharge on 11/20/2021 patient was receptive to plan.  Support, encouragement and reassurance was provided. ? ? ?

## 2021-11-19 NOTE — Progress Notes (Signed)
Patient is alert and oriented  X 3. Patient singing  loudly on the unit, restless pacing and talking with peers on the unit. Patient denies SI, HI and AVH. Patient asked if she can go home, RN explained that a provider will come and assess her and a decision would be made by the provider. Patient Denies SI, HI and auditory/visual hallucinations.  RN informed patient to try not to disturb the other patients who are trying to rest on the unit. Patient verbalized understanding and  continued to sing loudly. Patient then proceeded to go to the restroom and use the dryer to " warm my hands." Patient given a blanket, but still unable to sit down, very restless. RN informed provider. Provider to put in order for medication.  ?

## 2021-11-19 NOTE — Progress Notes (Addendum)
BHH/BMU LCSW Progress Note ?  ?11/19/2021    3:13 PM ? ?Kaitlyn Cline  ? ?CB:[id number]  ? ?Type of Contact and Topic:  Psychiatric Bed Placement  ? ?Pt accepted to [facility]  ? ?Patient meets inpatient criteria per Ricky Ala, NP   ? ?The attending provider will be Dr. Alcide Clever ? ?Call report to [phone] ? ?Jerry Caras, RN @ [facility] notified.    ? ?Pt scheduled  to arrive at [facility].  ? ? ?Mariea Clonts, MSW, LCSW-A  ?3:15 PM 11/19/2021   ?  ? ?  ?  ? ? ? ? ?  ?

## 2021-11-19 NOTE — Progress Notes (Signed)
Patient is not pressured in speech as before, able to talk logically. No loud singing.  Medication effective.  Nursing staff will continue to monitor. ? ?

## 2021-11-19 NOTE — Progress Notes (Signed)
Patient has been denied by [facility] due to no appropriate beds available. Patient meets BH inpatient criteria per Hillery Jacks, NP. Patient has been faxed out to the following facilities:  ? ? ?[facility]  [address] [place] [address] [phone]  ?[facility]  [address], [place] [address] [phone]  ?[facility]  [address] [place] [address] [phone]  ?[facility]  [address], [place] [address] [phone]  ?[facility]  [address] [place] [address] [phone]  ?[facility]  [address] [place], [place] [address] [phone]  ?[facility]  [address] [place], [place] [address] [phone]  ?[facility]  [address] [place] [address] [phone]  ?[facility]  [address] [place] [address] [phone]  ?[facility]  [address] [place] [address] [phone]  ?[facility]  [address] [place] [address] [phone]  ?[facility]  [address] [place] [address] [phone]  ? ?Damita Dunnings, MSW, LCSW-A  ?2:32 PM 11/19/2021   ?

## 2021-11-20 ENCOUNTER — Encounter (HOSPITAL_COMMUNITY): Payer: Self-pay | Admitting: Psychiatry

## 2021-11-20 ENCOUNTER — Other Ambulatory Visit: Payer: Self-pay

## 2021-11-20 ENCOUNTER — Inpatient Hospital Stay (HOSPITAL_COMMUNITY)
Admission: AD | Admit: 2021-11-20 | Discharge: 2021-12-01 | DRG: 885 | Disposition: A | Discharge status: Home / Self Care | Payer: Commercial Managed Care - PPO | Source: Intra-hospital | Attending: Emergency Medicine | Admitting: Emergency Medicine

## 2021-11-20 DIAGNOSIS — F419 Anxiety disorder, unspecified: Secondary | ICD-10-CM | POA: Diagnosis present

## 2021-11-20 DIAGNOSIS — Z79899 Other long term (current) drug therapy: Secondary | ICD-10-CM | POA: Diagnosis not present

## 2021-11-20 DIAGNOSIS — F3110 Bipolar disorder, current episode manic without psychotic features, unspecified: Principal | ICD-10-CM | POA: Diagnosis present

## 2021-11-20 DIAGNOSIS — F3164 Bipolar disorder, current episode mixed, severe, with psychotic features: Secondary | ICD-10-CM | POA: Diagnosis present

## 2021-11-20 DIAGNOSIS — G4700 Insomnia, unspecified: Secondary | ICD-10-CM | POA: Diagnosis present

## 2021-11-20 DIAGNOSIS — Z20822 Contact with and (suspected) exposure to covid-19: Secondary | ICD-10-CM | POA: Diagnosis present

## 2021-11-20 DIAGNOSIS — F312 Bipolar disorder, current episode manic severe with psychotic features: Principal | ICD-10-CM

## 2021-11-20 DIAGNOSIS — F3130 Bipolar disorder, current episode depressed, mild or moderate severity, unspecified: Secondary | ICD-10-CM | POA: Diagnosis not present

## 2021-11-20 DIAGNOSIS — F319 Bipolar disorder, unspecified: Secondary | ICD-10-CM | POA: Diagnosis present

## 2021-11-20 MED ORDER — PROPRANOLOL HCL 10 MG PO TABS: 10.0000 mg | ORAL_TABLET | Freq: Two times a day (BID) | ORAL | Status: DC

## 2021-11-20 MED ORDER — PROPRANOLOL HCL 10 MG PO TABS
10.0000 mg | ORAL_TABLET | Freq: Two times a day (BID) | ORAL | Status: DC
  Administered 2021-11-21 – 2021-11-23 (×6): 10 mg via ORAL
  Filled 2021-11-20 (×13): qty 1

## 2021-11-20 MED ORDER — PROPRANOLOL HCL 10 MG PO TABS
10.0000 mg | ORAL_TABLET | Freq: Two times a day (BID) | ORAL | Status: DC
  Administered 2021-11-20: 10 mg via ORAL
  Filled 2021-11-20: qty 1

## 2021-11-20 MED ORDER — TRAZODONE HCL 50 MG PO TABS
50.0000 mg | ORAL_TABLET | Freq: Every evening | ORAL | Status: DC | PRN
  Administered 2021-11-20 – 2021-11-29 (×9): 50 mg via ORAL
  Filled 2021-11-20 (×27): qty 1

## 2021-11-20 MED ORDER — DIVALPROEX SODIUM 500 MG PO DR TAB: 500.0000 mg | DELAYED_RELEASE_TABLET | Freq: Two times a day (BID) | ORAL | Status: DC

## 2021-11-20 MED ORDER — LORAZEPAM 1 MG PO TABS
2.0000 mg | ORAL_TABLET | Freq: Once | ORAL | Status: AC
  Administered 2021-11-20: 2 mg via ORAL
  Filled 2021-11-20: qty 2

## 2021-11-20 MED ORDER — MAGNESIUM HYDROXIDE 400 MG/5ML PO SUSP: 30.0000 mL | Freq: Every day | ORAL | Status: DC | PRN

## 2021-11-20 MED ORDER — ALUM & MAG HYDROXIDE-SIMETH 200-200-20 MG/5ML PO SUSP: 30.0000 mL | ORAL | Status: DC | PRN

## 2021-11-20 MED ORDER — TRAZODONE HCL 50 MG PO TABS: 50.0000 mg | ORAL_TABLET | Freq: Every evening | ORAL | Status: DC | PRN

## 2021-11-20 MED ORDER — ACETAMINOPHEN 325 MG PO TABS
650.0000 mg | ORAL_TABLET | Freq: Four times a day (QID) | ORAL | Status: DC | PRN
  Administered 2021-11-22: 650 mg via ORAL
  Administered 2021-11-24: 325 mg via ORAL
  Filled 2021-11-20 (×2): qty 2

## 2021-11-20 MED ORDER — HYDROXYZINE HCL 25 MG PO TABS
25.0000 mg | ORAL_TABLET | Freq: Three times a day (TID) | ORAL | Status: DC | PRN
  Administered 2021-11-20 – 2021-11-24 (×5): 25 mg via ORAL
  Filled 2021-11-20 (×3): qty 1

## 2021-11-20 MED ORDER — DIVALPROEX SODIUM 500 MG PO DR TAB
500.0000 mg | DELAYED_RELEASE_TABLET | Freq: Two times a day (BID) | ORAL | Status: DC
  Administered 2021-11-21 – 2021-12-01 (×20): 500 mg via ORAL
  Filled 2021-11-20 (×26): qty 1

## 2021-11-20 NOTE — Progress Notes (Signed)
Pt was accepted to [facility] today 11/20/2021; Bed Assignment 507-1 ? ?Dx Bipolar 1 DO ? ?Pt meets inpatient criteria per Thomes Lolling NP ? ?Attending Physician will be MD Nelda Marseille ? ?Report can be called to: Adult unit: [phone] ? ?Pt can arrive after Night shift [facility] will coordinate  ? ?Care Team notified: Ernie Hew, MD, Jerry Caras, RN, Thomes Lolling, NP, [facility] Lynnda Shields, RN. ? ?Nadara Mode, LCSWA ?11/20/2021 @ 5:37 PM ? ?

## 2021-11-20 NOTE — ED Notes (Signed)
Pt sitting on side of her bed singing and staring off. She is calm and cooperative. No c/o pain or distress ?

## 2021-11-20 NOTE — ED Notes (Signed)
Pt was roast beef and veggies for dinner ?

## 2021-11-20 NOTE — ED Provider Notes (Signed)
Behavioral Health Progress Note ? ?Date and Time: 11/20/2021 11:58 AM ?Name: Kaitlyn Cline ?MRN:  [id number] ? ?Subjective:     ?Kaitlyn Cline initially presented to [facility] on 11/18/2021 with chief complaint of difficulty sleeping and manic behavior.  She was admitted to the continuous assessment unit for overnight observation ? ?Kaitlyn Cline, 21 y.o., female patient seen face to face by this provider, consulted with Dr. Bronwen Betters; and chart reviewed on 11/20/21. Per chart review patient has a history of bipolar 1 disorder, psychosis, and insomnia.  She was admitted to  [facility] 11/26/202 and [facility] 07/2021.  She was discharged in December with Cogentin 1 mg twice daily, Haldol 10 mg nightly, 5 mg Haldol daily, lithium 300 mg twice daily, and propanolol 10 mg twice daily.  Patient is not compliant with medications.  States she has not taken these medications in "a long time". ? ?During evaluation Kaitlyn Cline is alert/oriented x4.  Her speech is at a moderate volume, tone and pace.  She makes fair eye contact.  Her judgment is poor. She is insightful at times. She is scattered and tangential in her thought process and has to ask for questions to be repeated.   She has a some what flat affect with a blank stare.  ? ?She did not sleep last night.  States she was up writing. She shows this Clinical research associate multiple pages of handwritten notes about diabetes medication.  States she did not sleep for 5-6 days before she came in for admission.   She endorses [facility]. States that she has been seeing double of certain things. She then gets down on the floor and tries to recreate when she was sitting so she could seeing double again.  States sometimes it is books or other objects.  She also reports seeing the floor move and shake.  She denies any auditory hallucinations.  She is delusional and paranoid. She denies SI/HI.  Objectively she does not appear to be responding to internal/external stimuli.  She is not  logical.  She is not able to be safely discharged at this time.  Per nursing notes patient was agitated in the middle of the night and singing out loud disturbing the milieu.  She was given as needed medication for agitation.  Discussed inpatient psychiatric admission with patient she is refusing. ? ?Collateral: Kaitlyn Cline patient's cousin.  Kaitlyn Cline lives in [place].  She states the plan is for [facility]  to come and live with them.  However, She has immediate safety concerns with patient being discharged. She does not feel that [name] is able to travel safely by plane or car.  She is in agreement with inpatient psychiatric admission.  She wants patient to be psychiatrically stable to be able to travel back to [place]. ? ? ? ?  ?  ?Diagnosis:  ?Final diagnoses:  ?Bipolar I disorder, most recent episode depressed (HCC)  ? ? ?Total Time spent with patient: 45 minutes ? ?Past Psychiatric History: see H&P ?Past Medical History: No past medical history on file. No past surgical history on file. ?Family History: No family history on file. ?Family Psychiatric  History: See H&P ?Social History:  ?Social History  ? ?Substance and Sexual Activity  ?Alcohol Use Yes  ?   ?Social History  ? ?Substance and Sexual Activity  ?Drug Use Yes  ? Types: Marijuana  ?  ?Social History  ? ?Socioeconomic History  ? Marital status: Single  ?  Spouse name: Not on file  ?  Number of children: Not on file  ? Years of education: 61  ? Highest education level: Some college, no degree  ?Occupational History  ? Not on file  ?Tobacco Use  ? Smoking status: Never  ? Smokeless tobacco: Not on file  ?Vaping Use  ? Vaping Use: Unknown  ?Substance and Sexual Activity  ? Alcohol use: Yes  ? Drug use: Yes  ?  Types: Marijuana  ? Sexual activity: Not Currently  ?Other Topics Concern  ? Not on file  ?Social History Narrative  ? Not on file  ? ?Social Determinants of Health  ? ?Financial Resource Strain: Not on file  ?Food Insecurity: Not on file  ?Transportation Needs:  Not on file  ?Physical Activity: Not on file  ?Stress: Not on file  ?Social Connections: Not on file  ? ?SDOH:  ?SDOH Screenings  ? ?Alcohol Screen: Not on file  ?Depression (PHQ2-9): Medium Risk  ? PHQ-2 Score: 12  ?Financial Resource Strain: Not on file  ?Food Insecurity: Not on file  ?Housing: Not on file  ?Physical Activity: Not on file  ?Social Connections: Not on file  ?Stress: Not on file  ?Tobacco Use: Unknown  ? Smoking Tobacco Use: Never  ? Smokeless Tobacco Use: Unknown  ? Passive Exposure: Not on file  ?Transportation Needs: Not on file  ? ?Additional Social History:  ?  ?Pain Medications: See MAR ?Prescriptions: See MAR ?Over the Counter: See MAR ?History of alcohol / drug use?:  (UTA with UDS pending) ?Longest period of sobriety (when/how long): Unknown ?Negative Consequences of Use:  (Awaiting UDA) ?Withdrawal Symptoms:  (Awaiting UDA) ?  ?  ?  ?  ?  ?  ?  ?  ?  ? ?Sleep: Poor ? ?Appetite:  Poor ? ?Current Medications:  ?Current Facility-Administered Medications  ?Medication Dose Route Frequency Provider Last Rate Last Admin  ? acetaminophen (TYLENOL) tablet 650 mg  650 mg Oral Q6H PRN Ajibola, Ene A, NP      ? alum & mag hydroxide-simeth (MAALOX/MYLANTA) 200-200-20 MG/5ML suspension 30 mL  30 mL Oral Q4H PRN Ajibola, Ene A, NP      ? divalproex (DEPAKOTE) DR tablet 500 mg  500 mg Oral Q12H Oneta Rack, NP   500 mg at 11/20/21 1007  ? hydrOXYzine (ATARAX) tablet 10 mg  10 mg Oral TID PRN Ajibola, Ene A, NP   10 mg at 11/19/21 0028  ? LORazepam (ATIVAN) tablet 1 mg  1 mg Oral QHS PRN Oneta Rack, NP   1 mg at 11/19/21 2102  ? magnesium hydroxide (MILK OF MAGNESIA) suspension 30 mL  30 mL Oral Daily PRN Ajibola, Ene A, NP      ? traZODone (DESYREL) tablet 50 mg  50 mg Oral QHS PRN Ajibola, Ene A, NP   50 mg at 11/19/21 2101  ? traZODone (DESYREL) tablet 50 mg  50 mg Oral Once PRN Ajibola, Ene A, NP      ? ?Current Outpatient Medications  ?Medication Sig Dispense Refill  ? [START ON 12/18/2021]  ARIPiprazole Lauroxil ER (ARISTADA) 1064 MG/3.9ML prefilled syringe Inject 1,064 mg into the muscle every 8 (eight) weeks.    ? melatonin 5 MG TABS Take 5-10 mg by mouth at bedtime.    ? traZODone (DESYREL) 50 MG tablet Take 1 tablet (50 mg total) by mouth at bedtime and may repeat dose one time if needed. 10 tablet 0  ? ? ?Labs  ?Lab Results:  ?Admission on 11/18/2021  ?Component Date  Value Ref Range Status  ? SARS Coronavirus 2 by RT PCR 11/18/2021 NEGATIVE  NEGATIVE Final  ? Comment: (NOTE) ?SARS-CoV-2 target nucleic acids are NOT DETECTED. ? ?The SARS-CoV-2 RNA is generally detectable in upper respiratory ?specimens during the acute phase of infection. The lowest ?concentration of SARS-CoV-2 viral copies this assay can detect is ?138 copies/mL. A negative result does not preclude SARS-Cov-2 ?infection and should not be used as the sole basis for treatment or ?other patient management decisions. A negative result may occur with  ?improper specimen collection/handling, submission of specimen other ?than nasopharyngeal swab, presence of viral mutation(s) within the ?areas targeted by this assay, and inadequate number of viral ?copies(<138 copies/mL). A negative result must be combined with ?clinical observations, patient history, and epidemiological ?information. The expected result is Negative. ? ?Fact Sheet for Patients:  ?BloggerCourse.comhttps://www.fda.gov/media/152166/download ? ?Fact Sheet for Healthcare Providers:  ?SeriousBroker.ithttps://www.fda.gov/media/152162/download ? ?This test is no  ?                        t yet approved or cleared by the [place] FDA and  ?has been authorized for detection and/or diagnosis of SARS-CoV-2 by ?FDA under an Emergency Use Authorization (EUA). This EUA will remain  ?in effect (meaning this test can be used) for the duration of the ?COVID-19 declaration under Section 564(b)(1) of the Act, 21 ?U.S.C.section 360bbb-3(b)(1), unless the authorization is terminated  ?or revoked sooner.  ? ? ?  ?  Influenza A by PCR 11/18/2021 NEGATIVE  NEGATIVE Final  ? Influenza B by PCR 11/18/2021 NEGATIVE  NEGATIVE Final  ? Comment: (NOTE) ?The Xpert Xpress SARS-CoV-2/FLU/RSV plus assay is intended as an aid ?in the

## 2021-11-20 NOTE — ED Notes (Signed)
Report called to [place], [place] 507-1, Pt is pending IVC, serve and transfer. ?

## 2021-11-20 NOTE — ED Notes (Signed)
Pt  becoming increasingly agitated, singing out loud, disturbing the milieu. NP Barbara [facility] notified.  PRN orders given. ?

## 2021-11-20 NOTE — ED Notes (Signed)
[employer] transport requested for serving of IVC papers. ?

## 2021-11-20 NOTE — Progress Notes (Signed)
Pt will not dc to [facility] tomorrow per pt's request. CSW communicated with [facility]. ? ?Maryjean Ka, MSW, LCSWA ?11/20/2021 1:03 AM ? ? ?

## 2021-11-20 NOTE — Progress Notes (Signed)
During shift patient has been compliant with medication, reading books and making bingo games. Patient was told about transfer to [facility] and verbalized understanding.  RN will report to next shift. ?

## 2021-11-20 NOTE — Tx Team (Signed)
Initial Treatment Plan ?11/20/2021 ?11:58 PM ?Kaitlyn Cline ?RUE:[id number] ? ? ? ?PATIENT STRESSORS: ?Financial difficulties   ?Marital or family conflict   ?Occupational concerns   ? ? ?PATIENT STRENGTHS: ?Communication skills  ?General fund of knowledge  ?Physical Health  ? ? ?PATIENT IDENTIFIED PROBLEMS: ?  ?Psychosis  ?Anxiety  ?  ?"Get help with food stamps and disability"  ?"Need a psychiatrist in [place] when I am discharged"  ?  ?  ?  ?  ? ?DISCHARGE CRITERIA:  ?Improved stabilization in mood, thinking, and/or behavior ?Need for constant or close observation no longer present ?Reduction of life-threatening or endangering symptoms to within safe limits ?Verbal commitment to aftercare and medication compliance ? ?PRELIMINARY DISCHARGE PLAN: ?Medication management ? ?PATIENT/FAMILY INVOLVEMENT: ?This treatment plan has been presented to and reviewed with the patient, Kaitlyn Cline.  The patient and family have been given the opportunity to ask questions and make suggestions. ? ?Levin Bacon, RN ?11/20/2021, 11:58 PM ?

## 2021-11-20 NOTE — ED Notes (Signed)
Pt was given cereal, muffin, and milk for breakfast.  

## 2021-11-21 ENCOUNTER — Encounter (HOSPITAL_COMMUNITY): Payer: Self-pay

## 2021-11-21 DIAGNOSIS — F312 Bipolar disorder, current episode manic severe with psychotic features: Secondary | ICD-10-CM

## 2021-11-21 MED ORDER — BENZTROPINE MESYLATE 1 MG PO TABS
1.0000 mg | ORAL_TABLET | Freq: Every day | ORAL | Status: DC
  Administered 2021-11-21: 1 mg via ORAL
  Filled 2021-11-21 (×2): qty 1

## 2021-11-21 MED ORDER — ZIPRASIDONE MESYLATE 20 MG IM SOLR: 20.0000 mg | INTRAMUSCULAR | Status: DC | PRN

## 2021-11-21 MED ORDER — HALOPERIDOL 5 MG PO TABS
5.0000 mg | ORAL_TABLET | Freq: Two times a day (BID) | ORAL | Status: DC
  Administered 2021-11-21 – 2021-11-22 (×2): 5 mg via ORAL
  Filled 2021-11-21 (×5): qty 1

## 2021-11-21 MED ORDER — HALOPERIDOL 5 MG PO TABS
5.0000 mg | ORAL_TABLET | Freq: Two times a day (BID) | ORAL | Status: DC
  Filled 2021-11-21 (×2): qty 1

## 2021-11-21 MED ORDER — HALOPERIDOL 5 MG PO TABS
5.0000 mg | ORAL_TABLET | Freq: Every day | ORAL | Status: AC
  Administered 2021-11-21: 5 mg via ORAL
  Filled 2021-11-21: qty 1

## 2021-11-21 MED ORDER — OLANZAPINE 5 MG PO TBDP
5.0000 mg | ORAL_TABLET | Freq: Three times a day (TID) | ORAL | Status: DC | PRN
  Administered 2021-11-21 – 2021-11-22 (×2): 5 mg via ORAL
  Filled 2021-11-21 (×2): qty 1

## 2021-11-21 MED ORDER — WHITE PETROLATUM EX OINT
TOPICAL_OINTMENT | CUTANEOUS | Status: AC
  Administered 2021-11-21: 1
  Filled 2021-11-21: qty 5

## 2021-11-21 MED ORDER — LORAZEPAM 1 MG PO TABS
1.0000 mg | ORAL_TABLET | ORAL | Status: AC | PRN
  Administered 2021-11-21: 1 mg via ORAL
  Filled 2021-11-21: qty 1

## 2021-11-21 NOTE — Group Note (Signed)
Recreation Therapy Group Note ? ? ?Group Topic:Leisure Education  ?Group Date: 11/21/2021 ?Start Time: 1010 ?End Time: 1140 ?Facilitators: Victorino Sparrow, LRT,CTRS ?Location: [facility] ? ? ?Goal Area(s) Addresses:  ?Patient will review and complete packet supporting identification of healthy leisure and recreation activities.  ? ?Activity:  Patients were given packets to dealing with what leisure and the benefits it provides to the individual.  The packet also included leisure activities to help patients identify different types of leisure (social, creative, relaxation, etc), what activities can be done as leisure and identifying their personal views of leisure. ? ? ?Affect/Mood: Appropriate ?  ?Participation Quality: Independent ?  ? ?Clinical Observations/Individualized Feedback:  LRT provided pt a packet reviewing leisure and its impact on emotional states and personal fulfillment.  Patients were to complete packets in their rooms at their own pace due to not being able to leave rooms due to illnesses on unit. ? ? ?Plan: Continue to engage patient in RT group sessions 2-3x/week. ? ? ?Victorino Sparrow, LRT,CTRS ?11/21/2021 1:47 PM ?

## 2021-11-21 NOTE — Progress Notes (Signed)
Patient has a flat affect; is anxious and preoccupied. She denies SI/HI/AVH. She rate anxiety as a 3 and depression as a 0. In relation to depression she said its's bittersweet and I don't want the fame from my family." 0-She is pacing up the hallway but is cooperative. Pt has taken medication with no reported side effects. 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 Pt reports appetite is good stating "I'm full my stomach is hard." Pt has been working on creating goals for the future. Patient remains safe on Q15 min checks.   ? ? ? 11/21/21 1032  ?Psych Admission Type (Psych Patients Only)  ?Admission Status Involuntary  ?Psychosocial Assessment  ?Patient Complaints Anxiety  ?Eye Contact Fair  ?Facial Expression Flat  ?Affect Flat  ?Speech Tangential  ?Interaction Assertive  ?Motor Activity Fidgety  ?Appearance/Hygiene Unremarkable  ?Behavior Characteristics Cooperative;Anxious;Fidgety  ?Mood Anxious;Pleasant  ?Thought Process  ?Coherency Circumstantial;Tangential  ?Content Preoccupation  ?Delusions WDL  ?Perception WDL  ?Hallucination None reported or observed  ?Judgment Poor  ?Confusion Mild  ?Danger to Self  ?Current suicidal ideation? Denies  ?Danger to Others  ?Danger to Others None reported or observed  ? ? ?

## 2021-11-21 NOTE — BH IP Treatment Plan (Signed)
Interdisciplinary Treatment and Diagnostic Plan Update ? ?11/21/2021 ?Time of Session: 9:10am  ?Kaitlyn Cline ?MRN: [id number] ? ?Principal Diagnosis: Bipolar I disorder, most recent episode (or current) manic ([place]) ? ?Secondary Diagnoses: Principal Problem: ?  Bipolar I disorder, most recent episode (or current) manic ([place]) ?Active Problems: ?  Bipolar 1 disorder ([place]) ? ? ?Current Medications:  ?Current Facility-Administered Medications  ?Medication Dose Route Frequency Provider Last Rate Last Admin  ? acetaminophen (TYLENOL) tablet 650 mg  650 mg Oral Q6H PRN Revonda Humphrey, NP      ? alum & mag hydroxide-simeth (MAALOX/MYLANTA) 200-200-20 MG/5ML suspension 30 mL  30 mL Oral Q4H PRN Revonda Humphrey, NP      ? divalproex (DEPAKOTE) DR tablet 500 mg  500 mg Oral Q12H Revonda Humphrey, NP      ? hydrOXYzine (ATARAX) tablet 25 mg  25 mg Oral TID PRN Revonda Humphrey, NP   25 mg at 11/21/21 0757  ? OLANZapine zydis (ZYPREXA) disintegrating tablet 5 mg  5 mg Oral Q8H PRN Prescilla Sours, PA-C      ? And  ? LORazepam (ATIVAN) tablet 1 mg  1 mg Oral PRN Prescilla Sours, PA-C      ? And  ? ziprasidone (GEODON) injection 20 mg  20 mg Intramuscular PRN Margorie John W, PA-C      ? magnesium hydroxide (MILK OF MAGNESIA) suspension 30 mL  30 mL Oral Daily PRN Revonda Humphrey, NP      ? propranolol (INDERAL) tablet 10 mg  10 mg Oral BID Revonda Humphrey, NP   10 mg at 11/21/21 0757  ? traZODone (DESYREL) tablet 50 mg  50 mg Oral QHS,MR X 1 Prescilla Sours, PA-C   50 mg at 11/20/21 2327  ? ?PTA Medications: ?Medications Prior to Admission  ?Medication Sig Dispense Refill Last Dose  ? [START ON 12/18/2021] ARIPiprazole Lauroxil ER (ARISTADA) 1064 MG/3.9ML prefilled syringe Inject 1,064 mg into the muscle every 8 (eight) weeks.     ? divalproex (DEPAKOTE) 500 MG DR tablet Take 1 tablet (500 mg total) by mouth every 12 (twelve) hours.     ? melatonin 5 MG TABS Take 5-10 mg by mouth at bedtime.     ? propranolol (INDERAL)  10 MG tablet Take 1 tablet (10 mg total) by mouth 2 (two) times daily.     ? traZODone (DESYREL) 50 MG tablet Take 1 tablet (50 mg total) by mouth at bedtime and may repeat dose one time if needed. 10 tablet 0   ? ? ?Patient Stressors: Financial difficulties   ?Marital or family conflict   ?Occupational concerns   ? ?Patient Strengths: Communication skills  ?General fund of knowledge  ?Physical Health  ? ?Treatment Modalities: Medication Management, Group therapy, Case management,  ?1 to 1 session with clinician, Psychoeducation, Recreational therapy. ? ? ?Physician Treatment Plan for Primary Diagnosis: Bipolar I disorder, most recent episode (or current) manic ([place]) ?Long Term Goal(s):    ? ?Short Term Goals:   ? ?Medication Management: Evaluate patient's response, side effects, and tolerance of medication regimen. ? ?Therapeutic Interventions: 1 to 1 sessions, Unit Group sessions and Medication administration. ? ?Evaluation of Outcomes: Not Met ? ?Physician Treatment Plan for Secondary Diagnosis: Principal Problem: ?  Bipolar I disorder, most recent episode (or current) manic ([place]) ?Active Problems: ?  Bipolar 1 disorder ([place]) ? ?Long Term Goal(s):    ? ?Short Term Goals:      ? ?Medication Management:  Evaluate patient's response, side effects, and tolerance of medication regimen. ? ?Therapeutic Interventions: 1 to 1 sessions, Unit Group sessions and Medication administration. ? ?Evaluation of Outcomes: Not Met ? ? ?RN Treatment Plan for Primary Diagnosis: Bipolar I disorder, most recent episode (or current) manic ([place]) ?Long Term Goal(s): Knowledge of disease and therapeutic regimen to maintain health will improve ? ?Short Term Goals: Ability to remain free from injury will improve, Ability to participate in decision making will improve, Ability to verbalize feelings will improve, Ability to disclose and discuss suicidal ideas, and Ability to identify and develop effective coping behaviors will  improve ? ?Medication Management: RN will administer medications as ordered by provider, will assess and evaluate patient's response and provide education to patient for prescribed medication. RN will report any adverse and/or side effects to prescribing provider. ? ?Therapeutic Interventions: 1 on 1 counseling sessions, Psychoeducation, Medication administration, Evaluate responses to treatment, Monitor vital signs and CBGs as ordered, Perform/monitor CIWA, COWS, AIMS and Fall Risk screenings as ordered, Perform wound care treatments as ordered. ? ?Evaluation of Outcomes: Not Met ? ? ?LCSW Treatment Plan for Primary Diagnosis: Bipolar I disorder, most recent episode (or current) manic ([place]) ?Long Term Goal(s): Safe transition to appropriate next level of care at discharge, Engage patient in therapeutic group addressing interpersonal concerns. ? ?Short Term Goals: Engage patient in aftercare planning with referrals and resources, Increase social support, Increase emotional regulation, Facilitate acceptance of mental health diagnosis and concerns, Identify triggers associated with mental health/substance abuse issues, and Increase skills for wellness and recovery ? ?Therapeutic Interventions: Assess for all discharge needs, 1 to 1 time with Education officer, museum, Explore available resources and support systems, Assess for adequacy in community support network, Educate family and significant other(s) on suicide prevention, Complete Psychosocial Assessment, Interpersonal group therapy. ? ?Evaluation of Outcomes: Not Met ? ? ?Progress in Treatment: ?Attending groups: No. ?Participating in groups: No. ?Taking medication as prescribed: Yes. ?Toleration medication: Yes. ?Family/Significant other contact made: Yes, individual(s) contacted:  Cousin  ?Patient understands diagnosis: No. ?Discussing patient identified problems/goals with staff: Yes. ?Medical problems stabilized or resolved: Yes. ?Denies suicidal/homicidal ideation:  Yes. ?Issues/concerns per patient self-inventory: No. ? ? ?New problem(s) identified: No, Describe:  None  ? ?New Short Term/Long Term Goal(s): medication stabilization, elimination of SI thoughts, development of comprehensive mental wellness plan.  ? ?Patient Goals: "To better myself and remain stable"  ? ?Discharge Plan or Barriers: Patient recently admitted. CSW will continue to follow and assess for appropriate referrals and possible discharge planning.  ? ?Reason for Continuation of Hospitalization: Delusions  ?Mania ?Medication stabilization ? ?Estimated Length of Stay: 3 to 5 days  ? ? ?Scribe for Treatment Team: ?Darleen [facility], Latanya Presser ?11/21/2021 ?11:14 AM ?

## 2021-11-21 NOTE — Progress Notes (Signed)
?   11/21/21 2100  ?Psych Admission Type (Psych Patients Only)  ?Admission Status Involuntary  ?Psychosocial Assessment  ?Patient Complaints Anxiety  ?Eye Contact Brief  ?Affect Irritable  ?Speech Argumentative  ?Interaction Assertive  ?Motor Activity Fidgety  ?Appearance/Hygiene Unremarkable  ?Behavior Characteristics Cooperative;Anxious  ?Mood Anxious  ?Aggressive Behavior  ?Effect No apparent injury  ?Thought Process  ?Coherency Circumstantial;Tangential  ?Content Preoccupation  ?Delusions WDL  ?Perception WDL  ?Hallucination None reported or observed  ?Judgment Poor  ?Confusion Mild  ?Danger to Self  ?Current suicidal ideation? Denies  ?Danger to Others  ?Danger to Others None reported or observed  ? ? ?
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
## 2021-11-21 NOTE — Progress Notes (Signed)
Pt coming out room needing frequent redirection, pt appears to be responding to internal stimuli at times ?

## 2021-11-21 NOTE — Plan of Care (Signed)
?  Problem: Education: ?Goal: Knowledge of [facility] Education information/materials will improve ?Outcome: Progressing ?Goal: Emotional status will improve ?Outcome: Progressing ?Goal: Mental status will improve ?Outcome: Progressing ?  ?Problem: Safety: ?Goal: Periods of time without injury will increase ?Outcome: Progressing ?  ?Problem: Self-Concept: ?Goal: Level of anxiety will decrease ?Outcome: Progressing ?  ?Problem: Education: ?Goal: Knowledge of the prescribed therapeutic regimen will improve ?Outcome: Progressing ?  ?Problem: Coping: ?Goal: Will verbalize feelings ?Outcome: Progressing ?  ?Problem: Health Behavior/Discharge Planning: ?Goal: Compliance with prescribed medication regimen will improve ?Outcome: Progressing ?  ?Problem: Role Relationship: ?Goal: Ability to interact with others will improve ?Outcome: Progressing ?  ?Problem: Self-Care: ?Goal: Ability to participate in self-care as condition permits will improve ?Outcome: Progressing ?  ?Problem: Self-Concept: ?Goal: Will verbalize positive feelings about self ?Outcome: Progressing ?  ?

## 2021-11-21 NOTE — Progress Notes (Signed)
Pt out her room stating she needed spiritual help, pt stated she wanted to get out of here, pt informed she needed to talk to the doctor. Pt needs frequent redirection .  ?

## 2021-11-21 NOTE — Progress Notes (Signed)
Writer tried to explain the process to pt, but pt stated she knew the process because she has been to 7 facilities, Clinical research associate informed pt to talk to the doctor .  ?

## 2021-11-21 NOTE — Progress Notes (Signed)
?   11/21/21 0500  ?Sleep  ?Number of Hours 3.25  ? ? ?
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
## 2021-11-21 NOTE — Progress Notes (Signed)
Patient ID: Kaitlyn Cline, female   DOB: 05-22-2001, 21 y.o.   MRN: [id number] ? ?Pt refused scheduled morning dose of Depakote 500 mg. ?

## 2021-11-21 NOTE — BHH Suicide Risk Assessment (Signed)
[facility] Admission Suicide Risk Assessment ? ? ?Nursing information obtained from:  Patient ?Demographic factors:  Adolescent or young adult, Unemployed ?Current Mental Status:  mania with psychotic features ?Loss Factors:  Decrease in vocational status, leave of absence from school ?Historical Factors:  Impulsivity, previous psychiatric diagnoses/treatments ?Risk Reduction Factors:  social supports ? ?Total Time Spent in Direct Patient Care:  ?I personally spent 45 minutes on the unit in direct patient care. The direct patient care time included face-to-face time with the patient, reviewing the patient's chart, communicating with other professionals, and coordinating care. Greater than 50% of this time was spent in counseling or coordinating care with the patient regarding goals of hospitalization, psycho-education, and discharge planning needs. ? ?Principal Problem: Bipolar I disorder, current or most recent episode manic, with psychotic features ([place]) ?Diagnosis:  Principal Problem: ?  Bipolar I disorder, current or most recent episode manic, with psychotic features ([place]) ? ?Subjective Data: The patient is a 21y/o female with h/o bipolar d/o and prior cannabis use d/o, who was admitted under IVC from [facility] for management of mania with psychotic features. Per admission notes, the patient was disorganized, reported VH, was delusional and paranoid, and was agitated while at [facility]. She had reportedly not been sleeping for 5-6 days prior to admission. Prior to transfer to [facility] she was started on VPA 500mg  bid (but has refused med), Inderal 10mg  bid and Trazodone 50mg  qhs.  ? ?On assessment, the patient states that she was admitted to [facility] in January and then to [facility] inpatient unit in February where she received an Aristada injection and was placed on Depakote.  She states that she did not take the Depakote after discharge and thought the Aristada injection was supposed to last her for bipolar management for 2  months.  She states that in the days leading up to admission, she had not slept for 5 to 6 days and a friend became concerned and brought her in for an assessment.  She admits that she has been restless with increased energy, singing, and occasionally having vague visual hallucinations.  She admits that earlier today she thought she heard a dog bark and is vague as to whether she has had any auditory hallucinations.  She admits to paranoid thoughts about her family.  She states that she was due to go back to [place] next week to live with her family and is hesitant about this plan.  She states that she is originally from [place] but had come out to school here in 2019 to attend [employer]. She is currently on a leave of absence from school but is still living with friends in student housing.  When questioned about additional psychotic symptoms she states that she does believe that people in the hospital are inserting thoughts into her mind but denies thought withdrawal, thought broadcasting, or magical thinking.  She denies any current SI or HI and denies any ideas of reference.  She states her energy and appetite have been good and she denies recent issues with depressed mood.  She denies any recent substance use other than having a sip of alcohol for the first time recently in the last 3 months.  She states her cannabis use is in remission. ? ?According to EHR, she was admitted to [facility] 10/12/21-10/23/21 for bipolar mania with psychotic features. She was discharged on VPA 500mg  bid and was loaded with Aristada and due for Aristada and initio with next injection due 12/18/21. She was admitted to [facility] [place] [facility] 08/02/21-08/08/21 for mania at which time she was discharged home on Haldol 5mg  qam and 10mg  qhs, Cogentin 0.5mg  bid, Lithium 300mg  bid, and Inderal 10mg  bid. She was admitted at [facility] 07/21/21-07/25/21 for mania and was discharged on Lithium 300mg  bid and Remeron 15mg  qhs.  ? ?Continued Clinical Symptoms:   ?Alcohol Use Disorder Identification Test Final Score (AUDIT): 0 ?The "Alcohol Use Disorders Identification Test", Guidelines for Use in Primary Care, Second Edition.  World Pharmacologist [facility]). ?Score between 0-7:  no or low risk or alcohol related problems. ?Score between 8-15:  moderate risk of alcohol related problems. ?Score between 16-19:  high risk of alcohol related problems. ?Score 20 or above:  warrants further diagnostic evaluation for alcohol dependence and treatment. ? ?CLINICAL FACTORS:  ? Bipolar Disorder: mania with psychosis ?More than one psychiatric diagnosis (previous cannabis use d/o) ?Previous Psychiatric Diagnoses and Treatments ? ?Musculoskeletal: ?Strength & Muscle Tone: within normal limits ?Gait & Station: normal ?Patient leans: N/A ? ?Psychiatric Specialty Exam: ? ?Presentation  ?General Appearance: casually dressed, fair hygiene, sitting in room with all linens in floor and pillows in floor ? ?Eye Contact:Fair ? ?Speech:rambling at times, clear and coherent ? ?Speech Volume:Normal ? ?Mood and Affect  ?Mood: Aloof, mildly anxious ? ?Affect: anxious ? ?Thought Process  ?Thought Processes:Tangential  ? ?Orientation:oriented to self, place, month, county and year ? ?Thought Content: Admits to AVH but is vague as to content or frequency; admits to belief in thought insertion and paranoia about family; denies magical thinking or ideas of reference; is not grossly responding to internal/external stimuli on exam; denies SI or HI ? ?History of Schizophrenia/Schizoaffective disorder:No ? ?Hallucinations:AVH - vague as to content or frequency ? ?Ideas of Reference:None ? ?Suicidal Thoughts:Suicidal Thoughts: No ? ?Homicidal Thoughts:Homicidal Thoughts: No ? ? ?Sensorium  ?Memory:Immediate Fair; Remote Fair; Recent Fair ? ?Judgment:Poor ? ?Insight:Poor - refusing meds ? ? ?Executive Functions  ?Concentration:Fair ? ?Attention Span:Fair ? ?Recall:Fair ? ?[place] ? ?Language:Fair ? ? ?Psychomotor Activity  ?Psychomotor Activity:Restless upon entering room but able to sit for interview ? ? ?Assets  ?Assets:Leisure Time; Physical Health; Resilience ? ? ?Sleep  ?3.25 hours ? ?Physical Exam ?Vitals and nursing note reviewed.  ?HENT:  ?   Head: Normocephalic.  ?Pulmonary:  ?   Effort: Pulmonary effort is normal.  ?Neurological:  ?   General: No focal deficit present.  ?   Mental Status: She is alert.  ? ?Review of Systems  ?Constitutional:  Negative for fever.  ?Respiratory:  Negative for cough and shortness of breath.   ?Cardiovascular:  Negative for chest pain.  ?Gastrointestinal:  Negative for diarrhea, nausea and vomiting.  ?Genitourinary:  Negative for dysuria.  ?Skin:  Negative for rash.  ?Neurological:  Negative for headaches.  ?Blood pressure 138/84, pulse (!) 118, temperature 98.8 ?F (37.1 ?C), temperature source Oral, resp. rate 16, height 5\' 2"  (1.575 m), weight 56.7 kg, SpO2 100 %. Body mass index is 22.86 kg/m?. ? ? ?COGNITIVE FEATURES THAT CONTRIBUTE TO RISK:  ?Closed-mindedness, Loss of executive function, and Thought constriction (tunnel vision)   ? ?SUICIDE RISK:  ? Moderate:  Some risk factors present including acute mania with psychotic features with impulsivity and identifiable protective factors, including available and accessible social support. ? ?PLAN OF CARE:  ?Patient admitted under IVC and 2nd opinion completed by [employer]. Admission labs reviewed: Respiratory panel negative, CBC WNL, CMP WNL, Lipid panel shows cholesterol 213 and HDL >135 with LDL not calculated  and triglycerides 24; VPA level <10, UPT negative, UDS negative. EKG pending.TSH pending. ? ?Extensive time was spent discussing medication options with the patient.  She was willing to resume Haldol which she had been on and tolerated well during her last admission on our unit in December 2022.  We will restart 5 mg twice daily and discussed the potential for Haldol  decanoate.  It was explained to the patient that she appears to be breaking through her Aristada long-acting injectable.  We gave her options of various mood stabilizers and she states that she would like to resume the Depakote that she had r

## 2021-11-21 NOTE — BHH Suicide Risk Assessment (Signed)
BHH INPATIENT:  Family/Significant Other Suicide Prevention Education ? ?Suicide Prevention Education:  ?Education Completed; Albertina Senegal,   (cousin) [phone] of family member/significant other) has been identified by the patient as the family member/significant other with whom the patient will be residing, and identified as the person(s) who will aid the patient in the event of a mental health crisis (suicidal ideations/suicide attempt).  With written consent from the patient, the family member/significant other has been provided the following suicide prevention education, prior to the and/or following the discharge of the patient. ? ?CSW spoke with patient cousin who reports that family will come and pick patient up upon patient discharge.  CSW let cousin know that Drs were thinking that she will be getting treatment for at least a week.  No additional safety concerns noted but discussed that family has been in contact with patient roommates that have provided additional information about patint manic state.  Patient will be living at [place], [place], [place].  They have no access to guns/weapons and no additional safety concerns.  Family is looking for outpatient providers at this time. CSW agreed to be in contact with cousin to follow up with more precise estimated discharge.  ? ?The suicide prevention education provided includes the following: ?Suicide risk factors ?Suicide prevention and interventions ?National Suicide Hotline telephone number ?[facility] assessment telephone number ?[facility] Emergency Assistance 911 ?[place] and/or Residential Mobile Crisis Unit telephone number ? ?Request made of family/significant other to: ?Remove weapons (e.g., guns, rifles, knives), all items previously/currently identified as safety concern.   ?Remove drugs/medications (over-the-counter, prescriptions, illicit drugs), all items previously/currently identified as a safety  concern. ? ?The family member/significant other verbalizes understanding of the suicide prevention education information provided.  The family member/significant other agrees to remove the items of safety concern listed above. ? ?Maye Parkinson E Jalasia Eskridge ?11/21/2021, 3:02 PM ?

## 2021-11-21 NOTE — Progress Notes (Signed)
Recreation Therapy Notes ? ?INPATIENT RECREATION THERAPY ASSESSMENT ? ?Patient Details ?Name: Kaitlyn Cline ?MRN: [id number] ?DOB: Oct 03, 2000 ?Today's Date: 11/21/2021 ?      ?Information Obtained From: ?Patient ? ?Able to Participate in Assessment/Interview: ?Yes ? ?Patient Presentation: ?Alert ? ?Reason for Admission (Per Patient): ?Other (Comments) ("not sleeping") ? ?Patient Stressors: ?Other (Comment) ("life") ? ?Coping Skills:   ?TV, Sports, Music, Exercise, Deep Breathing, Talk, Prayer, Avoidance, Read, Dance, Hot Bath/Shower ? ?Leisure Interests (2+):  ?Games - [employer], Individual - Reading, Music - Listen, Sports - Swimming, Individual - Other (Comment) (Skating) ? ?Frequency of Recreation/Participation: ?Other (Comment) (Music- Daily; Skating- Once every 3 months; Reading; Video Games; Swimming- None) ? ?Awareness of Community Resources:  ?Yes ? ?Community Resources:  ?Tree surgeon, [place], Other (Comment) (Mini Golf; Mad Midwife (pottery)) ? ?Current Use: ?Yes ? ?If no, Barriers?: ?  ? ?Expressed Interest in [employer] Information: ?No ? ?[place] of Residence:  ?[place] ? ?Patient Main Form of Transportation: ?Car ? ?Patient Strengths:  ?Resilience; Determination; Wit; [place] ? ?Patient Identified Areas of Improvement:  ?Isolation; Trust; Opening up; Expand Horizons (travel) ? ?Patient Goal for Hospitalization:  ?"sleep and play games" ? ?Current SI (including self-harm):  ?No ? ?Current HI:  ?No ? ?Current AVH: ? ("I don't know yet") ? ?Staff Intervention Plan: ?Group Attendance, Collaborate with Interdisciplinary Treatment Team ? ?Consent to Intern Participation: ?N/A ? ? ?Caroll Rancher, LRT,CTRS ?Caroll Rancher A ?11/21/2021, 2:04 PM ?
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
## 2021-11-21 NOTE — Group Note (Signed)
LCSW Group Therapy Note ? ? ?Group Date: 11/21/2021 ?Start Time: 1300 ?End Time: 1400 ? ?  ?Topic: Sleep Hygiene ?  ?Participation: Active ?  ?Due to the illness on the unit, Infectious Disease has recommended no close contact at this time, therefore group was not held. Patient was provided therapeutic worksheets and asked to meet with CSW to discuss those worksheets as needed.   ? ? ?Buford Bremer A Kinsey Cowsert, LCSW ?11/21/2021  1:20 PM   ? ?

## 2021-11-22 LAB — HEMOGLOBIN A1C
Hgb A1c MFr Bld: 4.8 % (ref 4.8–5.6)
Mean Plasma Glucose: 91.06 mg/dL

## 2021-11-22 LAB — TSH: TSH: 0.493 u[IU]/mL (ref 0.350–4.500)

## 2021-11-22 MED ORDER — LORAZEPAM 0.5 MG PO TABS
0.5000 mg | ORAL_TABLET | Freq: Once | ORAL | Status: AC
  Administered 2021-11-22: 0.5 mg via ORAL
  Filled 2021-11-22: qty 1

## 2021-11-22 MED ORDER — OLANZAPINE 5 MG PO TBDP: 5.0000 mg | ORAL_TABLET | Freq: Three times a day (TID) | ORAL | Status: DC | PRN

## 2021-11-22 MED ORDER — BENZTROPINE MESYLATE 0.5 MG PO TABS
0.5000 mg | ORAL_TABLET | Freq: Every day | ORAL | Status: DC
  Administered 2021-11-22 – 2021-11-23 (×2): 0.5 mg via ORAL
  Filled 2021-11-22 (×5): qty 1

## 2021-11-22 MED ORDER — HALOPERIDOL 5 MG PO TABS
5.0000 mg | ORAL_TABLET | Freq: Two times a day (BID) | ORAL | Status: DC
  Filled 2021-11-22 (×4): qty 1

## 2021-11-22 MED ORDER — BENZTROPINE MESYLATE 0.5 MG PO TABS
0.5000 mg | ORAL_TABLET | Freq: Every day | ORAL | Status: DC
  Filled 2021-11-22 (×2): qty 1

## 2021-11-22 MED ORDER — ZIPRASIDONE MESYLATE 20 MG IM SOLR: 20.0000 mg | Freq: Three times a day (TID) | INTRAMUSCULAR | Status: DC | PRN

## 2021-11-22 MED ORDER — HALOPERIDOL 5 MG PO TABS
10.0000 mg | ORAL_TABLET | Freq: Every day | ORAL | Status: DC
Start: 2021-11-22 — End: 2021-12-01
  Administered 2021-11-22 – 2021-11-30 (×9): 10 mg via ORAL
  Filled 2021-11-22 (×11): qty 2

## 2021-11-22 MED ORDER — HALOPERIDOL 5 MG PO TABS
5.0000 mg | ORAL_TABLET | Freq: Every day | ORAL | Status: DC
  Filled 2021-11-22 (×3): qty 1

## 2021-11-22 MED ORDER — LORAZEPAM 0.5 MG PO TABS
0.5000 mg | ORAL_TABLET | Freq: Two times a day (BID) | ORAL | Status: DC
  Administered 2021-11-22 – 2021-12-01 (×18): 0.5 mg via ORAL
  Filled 2021-11-22 (×19): qty 1

## 2021-11-22 MED ORDER — LORAZEPAM 1 MG PO TABS
1.0000 mg | ORAL_TABLET | Freq: Three times a day (TID) | ORAL | Status: DC | PRN
  Administered 2021-11-24: 1 mg via ORAL
  Filled 2021-11-22: qty 1

## 2021-11-22 NOTE — Plan of Care (Signed)
?  Problem: Coping: ?Goal: Ability to verbalize frustrations and anger appropriately will improve ?Outcome: Not Progressing ?  ?Problem: Coping: ?Goal: Ability to demonstrate self-control will improve ?Outcome: Not Progressing ?  ?Problem: Safety: ?Goal: Periods of time without injury will increase ?Outcome: Progressing ?  ?Problem: Education: ?Goal: Ability to state activities that reduce stress will improve ?Outcome: Progressing ?  ?

## 2021-11-22 NOTE — Progress Notes (Signed)
?   11/22/21 2200  ?Psych Admission Type (Psych Patients Only)  ?Admission Status Involuntary  ?Psychosocial Assessment  ?Patient Complaints Anxiety  ?Eye Contact Brief  ?Facial Expression Flat  ?Affect Irritable  ?Speech Argumentative  ?Interaction Assertive  ?Motor Activity Fidgety  ?Appearance/Hygiene Unremarkable  ?Behavior Characteristics Impulsive;Intrusive  ?Mood Preoccupied  ?Aggressive Behavior  ?Effect No apparent injury  ?Thought Process  ?Coherency Circumstantial;Tangential  ?Content Preoccupation  ?Delusions WDL  ?Perception WDL  ?Hallucination None reported or observed  ?Judgment Poor  ?Confusion Mild  ?Danger to Self  ?Current suicidal ideation? Denies  ?Danger to Others  ?Danger to Others None reported or observed  ? ?Pt argumentative , pt refused medication earlier, but took it with much encouragement.  ?

## 2021-11-22 NOTE — Progress Notes (Signed)
?   11/22/21 1125  ?Psych Admission Type (Psych Patients Only)  ?Admission Status Involuntary  ?Psychosocial Assessment  ?Patient Complaints Anxiety  ?Eye Contact Brief  ?Facial Expression Flat  ?Affect Irritable  ?Speech Argumentative;Tangential  ?Interaction Assertive  ?Motor Activity Restless;Fidgety  ?Appearance/Hygiene Unremarkable  ?Behavior Characteristics Impulsive;Intrusive  ?Mood Preoccupied  ?Thought Process  ?Coherency Circumstantial;Tangential  ?Content Preoccupation  ?Delusions WDL  ?Perception WDL  ?Hallucination None reported or observed  ?Judgment Poor  ?Confusion Mild  ?Danger to Self  ?Current suicidal ideation? Denies  ?Danger to Others  ?Danger to Others None reported or observed  ? ? ?

## 2021-11-22 NOTE — Group Note (Signed)
Recreation Therapy Group Note ? ? ?Group Topic:Health and Wellness  ?Group Date: 11/22/2021 ?Start Time: 1050 ?End Time: 1140 ?Facilitators: Victorino Sparrow, LRT,CTRS ?Location: [place] ? ? ?Goal Area(s) Addresses:  ?Patient will define components of whole wellness. ?Patient will verbalize benefit of whole wellness. ? ?Activity:  Music.  Due to the high acuity on the unit, LRT went room to room playing music for each patient in hopes it would calm the milieu and give patients a moment to relax, be calm and mental venture in a peaceful place in their minds.  ? ? ?Affect/Mood: Appropriate ?  ?Participation Level: Engaged ?  ?Participation Quality: Independent ?  ?Behavior: Appropriate ?  ? ?Clinical Observations/Individualized Feedback: Pt was attentive and appeared to be focused on the music that she chose to listen to.  Pt was also writing in her journal as the music played and was also singing along to the music.  Pt was appropriate and engaged throughout interaction.   ?  ? ?Plan: Continue to engage patient in RT group sessions 2-3x/week. ? ? ?Victorino Sparrow, LRT,CTRS ?11/22/2021 1:24 PM ?

## 2021-11-22 NOTE — Progress Notes (Signed)
Patient ID: Kaitlyn Cline, female   DOB: Sep 12, 2000, 21 y.o.   MRN: [id number] ? ?Pt pacing hallway, observed labile at times and irritable. Pt singing loudly on hallway, and she is intrusive during other pt's conversations with MD and nursing staff. ?

## 2021-11-22 NOTE — Progress Notes (Signed)
?   11/22/21 2683  ?Sleep  ?Number of Hours 9  ? ? ?

## 2021-11-22 NOTE — Progress Notes (Signed)
[facility] MD Progress Note ? ?11/22/2021 2:13 PM ?[name]  ?MRN:  [id number] ?Subjective:  Kaitlyn Cline is a 21 yo patient w/ PPH of Bipolar disorder type 1 and PMH of migraines who presented to [facility] under IVC endorsing bizarre behavior, decreased sleep, and euphoric mood. Patient IVC continued at [facility]. ? ?Case was discussed in the multidisciplinary team. MAR was reviewed and patient was compliant with medications although this AM she required significant encouragement from staff. She did require PRN Zyprexa for behaviors yesterday, but took the medication PO. ?  ?  ?Psychiatric Team made the following recommendations yesterday:  ?Bipolar 1 disorder, current episode manic severe with psychotic features ?- Start Haldol 5mg  BID ?- Start cogentin 1mg  QHS ?- Continue inderal 10mg  bid for anxiety/restlessness ?- Start Depakote 500mg  BID ?            Depakote lvl, CBC, CMP- 4/2 ?On assessment today patient is up early singing loudly. Patient is singing christian songs. Patient reports that she is no longer afraid to look out of her window but is still afraid to look in the mirror "the yes are the window to the soul." Patient reports that she has been spending a lot time with her neighbor patient and that patient gave Kayren her phone number. Patient shows the paper and also shows provider some of her written projects. Patient shows provider how she has been underlining phrases in the Patient of Rights and has decided that she is trying to consolidate it and has written a "consolidated" version for " people with low reading level." Patient also shows a "program" she has come up with for a children's camp. Patient reports she feels are guilt clear and denies having racing thoughts. Patient does endorse feeling euphoric and that she cannot sit still. Patient reports she is "Mother Earth" because it feels as thought everything is revolving around her.  Patient denies feeling that she has special powers and special messages.  Patient endorses AH saying that she continues to hear a dog in the hospital but denies VH since being seen by provider yesterday. Patient denies SI and HI. ? ? ? ?Notably patient is very organized and has a system for how she does things, but her reasoning for this are disorganized and loose. Patient is very intrusive and appears to be becoming hyperreligious demanding a bible.  ? ?Principal Problem: Bipolar I disorder, current or most recent episode manic, with psychotic features (HCC) ?Diagnosis: Principal Problem: ?  Bipolar I disorder, current or most recent episode manic, with psychotic features (HCC) ? ?Total Time spent with patient: 30 minutes ? ?Past Psychiatric History: See H&P ? ?Past Medical History: History reviewed. No pertinent past medical history. History reviewed. No pertinent surgical history. ?Family History: History reviewed. No pertinent family history. ?Family Psychiatric  History: See H&P ?Social History:  ?Social History  ? ?Substance and Sexual Activity  ?Alcohol Use Yes  ?   ?Social History  ? ?Substance and Sexual Activity  ?Drug Use Yes  ? Types: Marijuana  ?  ?Social History  ? ?Socioeconomic History  ? Marital status: Single  ?  Spouse name: Not on file  ? Number of children: Not on file  ? Years of education: 36  ? Highest education level: Some college, no degree  ?Occupational History  ? Not on file  ?Tobacco Use  ? Smoking status: Never  ? Smokeless tobacco: Not on file  ?Vaping Use  ? Vaping Use: Unknown  ?Substance and Sexual Activity  ?  Alcohol use: Yes  ? Drug use: Yes  ?  Types: Marijuana  ? Sexual activity: Not Currently  ?Other Topics Concern  ? Not on file  ?Social History Narrative  ? Not on file  ? ?Social Determinants of Health  ? ?Financial Resource Strain: Not on file  ?Food Insecurity: Not on file  ?Transportation Needs: Not on file  ?Physical Activity: Not on file  ?Stress: Not on file  ?Social Connections: Not on file  ? ?Additional Social History:  ?  ?  ?  ?  ?  ?   ?  ?  ?  ?  ?  ? ?Sleep: Good ? ?Appetite:  Good ? ?Current Medications: ?Current Facility-Administered Medications  ?Medication Dose Route Frequency Provider Last Rate Last Admin  ? acetaminophen (TYLENOL) tablet 650 mg  650 mg Oral Q6H PRN Ardis Hughsoleman, Carolyn H, NP      ? alum & mag hydroxide-simeth (MAALOX/MYLANTA) 200-200-20 MG/5ML suspension 30 mL  30 mL Oral Q4H PRN Ardis Hughsoleman, Carolyn H, NP      ? benztropine (COGENTIN) tablet 0.5 mg  0.5 mg Oral Q2000 Yale Golla B, MD      ? divalproex (DEPAKOTE) DR tablet 500 mg  500 mg Oral Q12H Ardis Hughsoleman, Carolyn H, NP   500 mg at 11/22/21 [id number]  ? haloperidol (HALDOL) tablet 10 mg  10 mg Oral QHS Massengill, Harrold DonathNathan, MD      ? Melene Muller[START ON 11/23/2021] haloperidol (HALDOL) tablet 5 mg  5 mg Oral Daily Massengill, Nathan, MD      ? hydrOXYzine (ATARAX) tablet 25 mg  25 mg Oral TID PRN Ardis Hughsoleman, Carolyn H, NP   25 mg at 11/21/21 0757  ? LORazepam (ATIVAN) tablet 0.5 mg  0.5 mg Oral BID Massengill, Nathan, MD      ? magnesium hydroxide (MILK OF MAGNESIA) suspension 30 mL  30 mL Oral Daily PRN Ardis Hughsoleman, Carolyn H, NP      ? OLANZapine zydis (ZYPREXA) disintegrating tablet 5 mg  5 mg Oral Q8H PRN Jaclyn Shaggyaylor, Cody W, PA-C   5 mg at 11/22/21 1341  ? And  ? ziprasidone (GEODON) injection 20 mg  20 mg Intramuscular PRN Melbourne Abtsaylor, Cody W, PA-C      ? propranolol (INDERAL) tablet 10 mg  10 mg Oral BID Ardis Hughsoleman, Carolyn H, NP   10 mg at 11/22/21 0813  ? traZODone (DESYREL) tablet 50 mg  50 mg Oral QHS,MR X 1 Jaclyn Shaggyaylor, Cody W, PA-C   50 mg at 11/21/21 2120  ? ? ?Lab Results:  ?Results for orders placed or performed during the hospital encounter of 11/20/21 (from the past 48 hour(s))  ?TSH     Status: None  ? Collection Time: 11/22/21  6:30 AM  ?Result Value Ref Range  ? TSH 0.493 0.350 - 4.500 uIU/mL  ?  Comment: Performed by a 3rd Generation assay with a functional sensitivity of <=0.01 uIU/mL. ?Performed at [facility], [address] [place], [place] [id number] ?  ?Hemoglobin A1c      Status: None  ? Collection Time: 11/22/21  6:30 AM  ?Result Value Ref Range  ? Hgb A1c MFr Bld 4.8 4.8 - 5.6 %  ?  Comment: (NOTE) ?Pre diabetes:          5.7%-6.4% ? ?Diabetes:              >6.4% ? ?Glycemic control for   <7.0% ?adults with diabetes ?  ? Mean Plasma Glucose 91.06 mg/dL  ?  Comment:  Performed at [facility], [address] [place], [place] [address]  ? ? ?Blood Alcohol level:  ?Lab Results  ?Component Value Date  ? ETH <10 08/01/2021  ? ? ?Metabolic Disorder Labs: ?Lab Results  ?Component Value Date  ? HGBA1C 4.8 11/22/2021  ? MPG 91.06 11/22/2021  ? MPG 96.8 08/01/2021  ? ?Lab Results  ?Component Value Date  ? PROLACTIN 8.6 08/01/2021  ? ?Lab Results  ?Component Value Date  ? CHOL 213 (H) 11/18/2021  ? TRIG 24 11/18/2021  ? HDL >135 11/18/2021  ? CHOLHDL NOT CALCULATED 11/18/2021  ? VLDL 5 11/18/2021  ? LDLCALC NOT CALCULATED 11/18/2021  ? LDLCALC 68 08/01/2021  ? ? ?Physical Findings: ?AIMS: Facial and Oral Movements ?Muscles of Facial Expression: None, normal ?Lips and Perioral Area: None, normal ?Jaw: None, normal ?Tongue: None, normal,Extremity Movements ?Upper (arms, wrists, hands, fingers): None, normal ?Lower (legs, knees, ankles, toes): None, normal, Trunk Movements ?Neck, shoulders, hips: None, normal, Overall Severity ?Severity of abnormal movements (highest score from questions above): None, normal ?Incapacitation due to abnormal movements: None, normal ?Patient's awareness of abnormal movements (rate only patient's report): No Awareness, Dental Status ?Current problems with teeth and/or dentures?: No ?Does patient usually wear dentures?: No  ?CIWA:    ?COWS:    ? ?Musculoskeletal: ?Strength & Muscle Tone: within normal limits ?Gait & Station: normal ?Patient leans: N/A ? ?Psychiatric Specialty Exam: ? ?Presentation  ?General Appearance: Casual ? ?Eye Contact:Minimal ? ?Speech:Clear and Coherent ? ?Speech Volume:Increased ? ?Handedness:Right ? ? ?Mood and Affect   ?Mood:Irritable ? ?Affect:Blunt ? ? ?Thought Process  ?Thought Processes:Disorganized ? ?Descriptions of Associations:Loose ? ?Orientation:Full (Time, Place and Person) ? ?Thought Content:Illogical; Scattered ? ?Histor

## 2021-11-23 MED ORDER — DIPHENHYDRAMINE HCL 50 MG/ML IJ SOLN: 50.0000 mg | Freq: Four times a day (QID) | INTRAMUSCULAR | Status: DC | PRN

## 2021-11-23 MED ORDER — DIPHENHYDRAMINE HCL 25 MG PO CAPS: 50.0000 mg | ORAL_CAPSULE | Freq: Three times a day (TID) | ORAL | Status: DC | PRN

## 2021-11-23 MED ORDER — BENZTROPINE MESYLATE 1 MG PO TABS
1.0000 mg | ORAL_TABLET | Freq: Every day | ORAL | Status: DC
  Administered 2021-11-25 – 2021-12-01 (×7): 1 mg via ORAL
  Filled 2021-11-23 (×10): qty 1

## 2021-11-23 MED ORDER — HALOPERIDOL 5 MG PO TABS
10.0000 mg | ORAL_TABLET | Freq: Every day | ORAL | Status: DC
  Administered 2021-11-25 – 2021-12-01 (×7): 10 mg via ORAL
  Filled 2021-11-23 (×10): qty 2

## 2021-11-23 NOTE — Progress Notes (Signed)
Pt denies SI/HI/AVH and verbally agrees to approach staff if these become apparent or before harming themselves/others. Rates depression 4/10. Rates anxiety 6/10. Rates pain 0/10. Pt was more calm this morning. Pt stated that she heard a puppy crying in the vent this morning. Pt has been pacing the halls from time to time and loudly singing. Pt is needy but somewhat redirectable. Scheduled medications administered to pt, per MD orders. RN provided support and encouragement to pt. Q15 min safety checks implemented and continued. Pt safe on the unit. RN will continue to monitor and intervene as needed.  ? 11/23/21 0809  ?Psych Admission Type (Psych Patients Only)  ?Admission Status Involuntary  ?Psychosocial Assessment  ?Patient Complaints Anxiety;Depression  ?Eye Contact Brief;Poor  ?Facial Expression Flat;Blank  ?Affect Anxious  ?Speech Logical/coherent  ?Interaction Assertive;Needy  ?Motor Activity Slow  ?Appearance/Hygiene Unremarkable  ?Behavior Characteristics Cooperative;Calm  ?Mood Pleasant;Anxious  ?Thought Process  ?Coherency Circumstantial  ?Content Religiosity;Preoccupation  ?Delusions None reported or observed  ?Perception WDL  ?Hallucination None reported or observed  ?Judgment Poor  ?Confusion Mild  ?Danger to Self  ?Current suicidal ideation? Denies  ?Danger to Others  ?Danger to Others None reported or observed  ? ? ?

## 2021-11-23 NOTE — Progress Notes (Signed)
?   11/23/21 2200  ?Psych Admission Type (Psych Patients Only)  ?Admission Status Involuntary  ?Psychosocial Assessment  ?Patient Complaints Anxiety;Depression  ?Eye Contact Brief  ?Facial Expression Flat  ?Affect Irritable  ?Speech Argumentative  ?Interaction Assertive  ?Motor Activity Fidgety  ?Appearance/Hygiene Unremarkable  ?Aggressive Behavior  ?Effect No apparent injury  ?Thought Process  ?Coherency Circumstantial;Tangential  ?Content Preoccupation  ?Delusions WDL  ?Perception WDL  ?Hallucination None reported or observed  ?Judgment Poor  ?Confusion Mild  ?Danger to Self  ?Current suicidal ideation? Denies  ?Danger to Others  ?Danger to Others None reported or observed  ? ? ?

## 2021-11-23 NOTE — Progress Notes (Signed)
?   11/23/21 0500  ?Sleep  ?Number of Hours 6.5  ? ? ?

## 2021-11-23 NOTE — [facility] Group Notes (Signed)
?  Spirituality group facilitated by Kathrynn Humble, [place].  ? ?Group Description: Group focused on topic of hope. Patients participated in facilitated discussion around topic, connecting with one another around experiences and definitions for hope. Group members engaged with visual explorer photos, reflecting on what hope looks like for them today. Group engaged in discussion around how their definitions of hope are present today in hospital.  ? ?Modalities: Psycho-social ed, Adlerian, Narrative, MI  ? ?Patient Progress: Kaitlyn Cline engaged in group conversation. At times, her comments were tangential, but she was able to be redirected.  She also had minor and brief verbal conflicts with 2 of the other patients, but these did not escalate. She was able to identify some elements of hope for herself.   ? ?[employer], Bcc ?Pager, [phone] ?

## 2021-11-23 NOTE — Progress Notes (Signed)
Child/Adolescent Psychoeducational Group Note ? ?Date:  11/23/2021 ?Time:  8:50 PM ? ?Group Topic/Focus:  Wrap-Up Group:   The focus of this group is to help patients review their daily goal of treatment and discuss progress on daily workbooks. ? ?Participation Level:  Did Not Attend ? ?Participation Quality:  Did Not Attend ? ?Affect:  Did Not Attend ? ?Cognitive:  Did Not Attend ? ?Insight:  None ? ?Engagement in Group:  Did Not Attend ? ?Modes of Intervention:  Did Not Attend ? ?Additional Comments:   ?Pt was encouraged to attend wrap up group but refused  ? ?Kaitlyn Cline ?11/23/2021, 8:50 PM ?

## 2021-11-23 NOTE — B[facility] Counselor (Addendum)
CSW received voicemail from patient cousin and person that patient will be staying with, Kaitlyn Cline.  We do not have consent to speak with her. She reports that patient continues to call with some concerning statements including that the patients are being strip searched and that she has a bad feeling she will be dead today.  CSW notified provider of accusations and comments. They requested a call from physician to the cousin who they do have permission to speak with, Kaitlyn Cline. Her number is  [phone]. ? ? ?Megham Dwyer, LCSW, LCAS ?Clincal Social Worker  ?[facility] ? ?

## 2021-11-23 NOTE — Group Note (Signed)
LCSW Group Therapy Note ? ? ?Group Date: 11/23/2021 ?Start Time: 1100 ?End Time: 1200 ? ? ?Type of Therapy and Topic:  Group Therapy: Problem Solving ? ?Participation Level:  Active ? ? ?Summary of Patient Progress: Due acuity on the unit group was not held. Patient was provided therapeutic worksheets and asked to meet with CSW to discuss those worksheets as needed.  ? ? ? ?Eriona Kinchen A Savita Runner, LCSW ?11/23/2021  2:25 PM   ? ?

## 2021-11-23 NOTE — Progress Notes (Addendum)
[facility] Progress [facility] ? ?11/23/2021 5:33 PM ?Kaitlyn MillersMaya Ahonen  ?MRN:  [id number] ?Subjective:  Kaitlyn Cline is a 21 yo patient w/ PPH of Bipolar disorder type 1 and PMH of migraines who [facility] endorsing bizarre behavior, decreased sleep, and euphoric mood. [facility]. ?  ?Case was discussed in the multidisciplinary team. MAR was reviewed and patient was compliant with medications although this AM she required significant encouragement from staff. She required PRN Zyprexa again yesterday afternoon for agitation.  ? ? ?On assessment this AM patient reports that she feels she is being mistreated and this is why she has been calling her family to tell them things. Patient reports that she is feeling like she should not trust people and would like to keep to herself today. Patient reports that she is no longer afraid to look in the mirror or out her window. Patient denies SI, HI, and AVH. Patient reports that she has enjoyed her stress ball and wants to trade it out for another. Patient reports she is eating and sleeping well. ? ?Patient shows patient where she has been hearing the "dying dog or something" and endorses that it is only when her heater turns on and off. Patient turns the heater on to show provider and when he turns off during assessment it did sound like the dog that patient says she has been hearing for the last few days. Unfortunately patient disclosed she thinks someone is trying to play tricks on her because of this.  ? ?Principal Problem: Bipolar I disorder, current or most recent episode manic, with psychotic features (HCC) ?Diagnosis: Principal Problem: ?  Bipolar I disorder, current or most recent episode manic, with psychotic features (HCC) ? ?Total Time spent with patient: 20 minutes ? ?Past Psychiatric History: See H&P ? ?Past Medical History: History reviewed. No pertinent past medical history. History reviewed. No pertinent surgical history. ?Family History:  History reviewed. No pertinent family history. ?Family Psychiatric  History:  See H&P ?Social History:  ?Social History  ? ?Substance and Sexual Activity  ?Alcohol Use Yes  ?   ?Social History  ? ?Substance and Sexual Activity  ?Drug Use Yes  ? Types: Marijuana  ?  ?Social History  ? ?Socioeconomic History  ? Marital status: Single  ?  Spouse name: Not on file  ? Number of children: Not on file  ? Years of education: 714  ? Highest education level: Some college, no degree  ?Occupational History  ? Not on file  ?Tobacco Use  ? Smoking status: Never  ? Smokeless tobacco: Not on file  ?Vaping Use  ? Vaping Use: Unknown  ?Substance and Sexual Activity  ? Alcohol use: Yes  ? Drug use: Yes  ?  Types: Marijuana  ? Sexual activity: Not Currently  ?Other Topics Concern  ? Not on file  ?Social History Narrative  ? Not on file  ? ?Social Determinants of Health  ? ?Financial Resource Strain: Not on file  ?Food Insecurity: Not on file  ?Transportation Needs: Not on file  ?Physical Activity: Not on file  ?Stress: Not on file  ?Social Connections: Not on file  ? ?Additional Social History:  ?  ?  ?  ?  ?  ?  ?  ?  ?  ?  ?  ? ?Sleep: Fair ? ?Appetite:  Good ? ?Current Medications: ?Current Facility-Administered Medications  ?Medication Dose Route Frequency Provider Last Rate Last Admin  ? acetaminophen (TYLENOL) tablet 650  mg  650 mg Oral Q6H PRN Ardis Hughs, NP   650 mg at 11/22/21 2205  ? alum & mag hydroxide-simeth (MAALOX/MYLANTA) 200-200-20 MG/5ML suspension 30 mL  30 mL Oral Q4H PRN Ardis Hughs, NP      ? Melene Muller ON 11/24/2021] benztropine (COGENTIN) tablet 1 mg  1 mg Oral Daily Bradee Common, Gerlean Ren B, MD      ? divalproex (DEPAKOTE) DR tablet 500 mg  500 mg Oral Q12H Ardis Hughs, NP   500 mg at 11/23/21 9563  ? haloperidol (HALDOL) tablet 10 mg  10 mg Oral QHS Phineas Inches, MD   10 mg at 11/22/21 2035  ? [START ON 11/24/2021] haloperidol (HALDOL) tablet 10 mg  10 mg Oral Daily Eliseo Gum B, MD      ?  hydrOXYzine (ATARAX) tablet 25 mg  25 mg Oral TID PRN Ardis Hughs, NP   25 mg at 11/22/21 1516  ? LORazepam (ATIVAN) tablet 0.5 mg  0.5 mg Oral BID Massengill, Harrold Donath, MD   0.5 mg at 11/23/21 1723  ? LORazepam (ATIVAN) tablet 1 mg  1 mg Oral Q8H PRN Massengill, Nathan, MD      ? magnesium hydroxide (MILK OF MAGNESIA) suspension 30 mL  30 mL Oral Daily PRN Ardis Hughs, NP      ? OLANZapine zydis (ZYPREXA) disintegrating tablet 5 mg  5 mg Oral Q8H PRN Massengill, Harrold Donath, MD      ? And  ? ziprasidone (GEODON) injection 20 mg  20 mg Intramuscular Q8H PRN Massengill, Nathan, MD      ? propranolol (INDERAL) tablet 10 mg  10 mg Oral BID Ardis Hughs, NP   10 mg at 11/23/21 1640  ? traZODone (DESYREL) tablet 50 mg  50 mg Oral QHS,MR X 1 Melbourne Abts W, PA-C   50 mg at 11/22/21 2034  ? ? ?Lab Results:  ?Results for orders placed or performed during the hospital encounter of 11/20/21 (from the past 48 hour(s))  ?TSH     Status: None  ? Collection Time: 11/22/21  6:30 AM  ?Result Value Ref Range  ? TSH 0.493 0.350 - 4.500 uIU/mL  ?  Comment: Performed by a 3rd Generation assay with a functional sensitivity of <=0.01 uIU/mL. ?Performed at [facility], [address], [place], [place] [address] ?  ?Hemoglobin A1c     Status: None  ? Collection Time: 11/22/21  6:30 AM  ?Result Value Ref Range  ? Hgb A1c MFr Bld 4.8 4.8 - 5.6 %  ?  Comment: (NOTE) ?Pre diabetes:          5.7%-6.4% ? ?Diabetes:              >6.4% ? ?Glycemic control for   <7.0% ?adults with diabetes ?  ? Mean Plasma Glucose 91.06 mg/dL  ?  Comment: Performed at [facility], [address], [place], [place] [address]  ? ? ?Blood Alcohol level:  ?Lab Results  ?Component Value Date  ? ETH <10 08/01/2021  ? ? ?Metabolic Disorder Labs: ?Lab Results  ?Component Value Date  ? HGBA1C 4.8 11/22/2021  ? MPG 91.06 11/22/2021  ? MPG 96.8 08/01/2021  ? ?Lab Results  ?Component Value Date  ? PROLACTIN 8.6 08/01/2021  ? ?Lab Results   ?Component Value Date  ? CHOL 213 (H) 11/18/2021  ? TRIG 24 11/18/2021  ? HDL >135 11/18/2021  ? CHOLHDL NOT CALCULATED 11/18/2021  ? VLDL 5 11/18/2021  ?  LDLCALC NOT CALCULATED 11/18/2021  ? LDLCALC 68 08/01/2021  ? ? ?Physical Findings: ?AIMS: Facial and Oral Movements ?Muscles of Facial Expression: None, normal ?Lips and Perioral Area: None, normal ?Jaw: None, normal ?Tongue: None, normal,Extremity Movements ?Upper (arms, wrists, hands, fingers): None, normal ?Lower (legs, knees, ankles, toes): None, normal, Trunk Movements ?Neck, shoulders, hips: None, normal, Overall Severity ?Severity of abnormal movements (highest score from questions above): None, normal ?Incapacitation due to abnormal movements: None, normal ?Patient's awareness of abnormal movements (rate only patient's report): No Awareness, Dental Status ?Current problems with teeth and/or dentures?: No ?Does patient usually wear dentures?: No  ?CIWA:    ?COWS:    ? ?Musculoskeletal: ?Strength & Muscle Tone: within normal limits ?Gait & Station: normal ?Patient leans: N/A ? ?Psychiatric Specialty Exam: ? ?Presentation  ?General Appearance: Casual ? ?Eye Contact:Minimal ? ?Speech:Clear and Coherent ? ?Speech Volume:Decreased ? ?Handedness:Right ? ? ?Mood and Affect  ?Mood:Euthymic ? ?Affect:Blunt ? ? ?Thought Process  ?Thought Processes:Goal Directed ? ?Descriptions of Associations:Circumstantial ? ?Orientation:Full (Time, Place and Person) ? ?Thought Content:Paranoid Ideation ? ?History of Schizophrenia/Schizoaffective disorder:No ? ?Duration of Psychotic Symptoms:Less than six months ? ?Hallucinations:Hallucinations: None ? ?Ideas of Reference:Paranoia ? ?Suicidal Thoughts:Suicidal Thoughts: No ? ?Homicidal Thoughts:Homicidal Thoughts: No ? ? ?Sensorium  ?Memory:Immediate Fair; Recent Good ? ?Judgment:-- (Improving) ? ?Insight:Shallow ? ? ?Executive Functions  ?Concentration:Fair ? ?Attention Span:Fair ? ?Recall:Good ? ?Fund of  Knowledge:Good ? ?Language:Good ? ? ?Psychomotor Activity  ?Psychomotor Activity:Psychomotor Activity: Decreased; Psychomotor Retardation (Resident did not notes stiffiness on exam but attending noted stiffness in LUE) ? ? ?Assets  ?A

## 2021-11-24 ENCOUNTER — Encounter (HOSPITAL_COMMUNITY): Payer: Self-pay | Admitting: Psychiatry

## 2021-11-24 MED ORDER — PROPRANOLOL HCL 20 MG PO TABS
20.0000 mg | ORAL_TABLET | Freq: Two times a day (BID) | ORAL | Status: DC
  Administered 2021-11-24 – 2021-11-29 (×10): 20 mg via ORAL
  Filled 2021-11-24 (×15): qty 1

## 2021-11-24 MED ORDER — WHITE PETROLATUM EX OINT
TOPICAL_OINTMENT | CUTANEOUS | Status: AC
  Administered 2021-11-24: 1
  Filled 2021-11-24: qty 5

## 2021-11-24 MED ORDER — WHITE PETROLATUM EX OINT
TOPICAL_OINTMENT | CUTANEOUS | Status: AC
Start: 2021-11-24 — End: 2021-11-24
  Filled 2021-11-24: qty 5

## 2021-11-24 NOTE — Progress Notes (Signed)
?   11/24/21 0500  ?Sleep  ?Number of Hours 7  ? ? ?

## 2021-11-24 NOTE — Plan of Care (Addendum)
?  Problem: Coping: ?Goal: Ability to demonstrate self-control will improve ?Outcome: Not Progressing ?  ?Problem: Safety: ?Goal: Periods of time without injury will increase ?Outcome: Progressing ?  ?Problem: Self-Concept: ?Goal: Level of anxiety will decrease ?Outcome: Not Progressing ?  ?Problem: Nutritional: ?Goal: Ability to achieve adequate nutritional intake will improve ?Outcome: Progressing ?  ?

## 2021-11-24 NOTE — Plan of Care (Signed)
?  Problem: Coping: ?Goal: Ability to demonstrate self-control will improve ?Outcome: Not Progressing ?  ?Problem: Safety: ?Goal: Periods of time without injury will increase ?Outcome: Progressing ?  ?Problem: Self-Concept: ?Goal: Level of anxiety will decrease ?Outcome: Not Progressing ?  ?Problem: Nutritional: ?Goal: Ability to achieve adequate nutritional intake will improve ?Outcome: Progressing ?  ?Problem: Role Relationship: ?Goal: Ability to communicate needs accurately will improve ?Outcome: Not Progressing ?  ?Problem: Role Relationship: ?Goal: Ability to interact with others will improve ?Outcome: Not Progressing ?  ?

## 2021-11-24 NOTE — [facility] Group Notes (Signed)
Group Date: 11/24/2021 ?Start Time: 11:30am ?End Time: 12:00pm ?  ?  ?Type of Therapy and Topic:  Group Therapy: Anger ?  ?Participation Level:  Active ?  ?Description of Group:   Due to the acuity on the unit, staff recommended no close contact at this time so group was not held.  Patient was provided with written information and worksheets about anger.  CSW could not go over information with the patient, as she was on the phone. ?  ?  ?  ?Ambrose Mantle, LCSW ?11/24/2021 ?

## 2021-11-24 NOTE — Progress Notes (Signed)
?   11/24/21 0955  ?Psych Admission Type (Psych Patients Only)  ?Admission Status Involuntary  ?Psychosocial Assessment  ?Patient Complaints Anxiety;Depression  ?Eye Contact Brief  ?Facial Expression Flat  ?Affect Irritable;Depressed  ?Speech Tangential  ?Interaction Assertive  ?Motor Activity Slow;Fidgety  ?Appearance/Hygiene Unremarkable  ?Behavior Characteristics Anxious  ?Thought Process  ?Coherency Circumstantial;Tangential  ?Content Preoccupation  ?Delusions WDL  ?Perception WDL  ?Hallucination None reported or observed  ?Judgment Poor  ?Confusion Mild  ?Danger to Self  ?Current suicidal ideation? Denies  ?Danger to Others  ?Danger to Others None reported or observed  ? ? ?

## 2021-11-24 NOTE — Progress Notes (Signed)
Pt up anxious, having hard time sleeping, pt given second dose Trazodone and PRN Ativan per MAR ?

## 2021-11-24 NOTE — [facility] Group Notes (Signed)
Patient did not attend the goals group. 

## 2021-11-24 NOTE — Progress Notes (Signed)
Adult Psychoeducational Group Note ? ?Date:  11/24/2021 ?Time:  8:54 PM ? ?Group Topic/Focus:  ?Wrap-Up Group:   The focus of this group is to help patients review their daily goal of treatment and discuss progress on daily workbooks. ? ?Participation Level:  Did Not Attend ? ?Participation Quality:   Did Not Attend ? ?Affect:   Did Not Attend ? ?Cognitive:   Did Not Attend ? ?Insight: None ? ?Engagement in Group:   Did Not Attend  ? ?Modes of Intervention:   Did Not Attend ? ?Additional Comments:  Pt was encouraged to attend wrap up group but did not attend ? ?Felipa Furnace ?11/24/2021, 8:54 PM ?

## 2021-11-24 NOTE — Progress Notes (Addendum)
[facility] MD Progress Note ? ?11/24/2021 2:16 PM ?Kaitlyn Cline  ?MRN:  [id number] ?Subjective:   ? ? ?On assessment this AM patient is sleeping, but wakes up and eventually sat up during assessment. Patient reports that she slept last night and endorses that she feels more tired than normal. Patient reports that she finally "told" her family where she is and report she is ready to sign a medical release form so they have access to her medical records. Patient reports that she has no trouble looking out her window or in the mirror. Patient endorses that she does not feel her thoughts are too slow, rapid, or cloudy. Patient reports that she is keeping a journal and shows provider how she keeps up with the dates. Patient signs a song for provider but reports it is not original and it is actually a song she likes to listen to in the mornings normally. Patient endorses that she would like to spend her morning singing in her room to herself. Patient denies SI, HI, and AVH on assessment.  ? ? ? ?Patient slept later today and appeared more tired, but not struggling to stay awake. Patient's journal shows that her "poems" are actually fragmented thoughts that she writes in a neat order. Patient could not explain why she had written certain things in her journal.  ? ? ?Principal Problem: Bipolar I disorder, current or most recent episode manic, with psychotic features (HCC) ?Diagnosis: Principal Problem: ?  Bipolar I disorder, current or most recent episode manic, with psychotic features (HCC) ? ?Total Time spent with patient: 20 minutes ? ?Past Psychiatric History: See H&P ? ?Past Medical History: History reviewed. No pertinent past medical history. History reviewed. No pertinent surgical history. ?Family History: History reviewed. No pertinent family history. ?Family Psychiatric  History:  See H&P ?Social History:  ?Social History  ? ?Substance and Sexual Activity  ?Alcohol Use Yes  ?   ?Social History  ? ?Substance and Sexual  Activity  ?Drug Use Yes  ? Types: Marijuana  ?  ?Social History  ? ?Socioeconomic History  ? Marital status: Single  ?  Spouse name: Not on file  ? Number of children: Not on file  ? Years of education: 4  ? Highest education level: Some college, no degree  ?Occupational History  ? Not on file  ?Tobacco Use  ? Smoking status: Never  ? Smokeless tobacco: Not on file  ?Vaping Use  ? Vaping Use: Unknown  ?Substance and Sexual Activity  ? Alcohol use: Yes  ? Drug use: Yes  ?  Types: Marijuana  ? Sexual activity: Not Currently  ?Other Topics Concern  ? Not on file  ?Social History Narrative  ? Not on file  ? ?Social Determinants of Health  ? ?Financial Resource Strain: Not on file  ?Food Insecurity: Not on file  ?Transportation Needs: Not on file  ?Physical Activity: Not on file  ?Stress: Not on file  ?Social Connections: Not on file  ? ?Additional Social History:  ?  ?  ?  ?  ?  ?  ?  ?  ?  ?  ?  ? ?Sleep: Good ? ?Appetite:  Good ? ?Current Medications: ?Current Facility-Administered Medications  ?Medication Dose Route Frequency Provider Last Rate Last Admin  ? acetaminophen (TYLENOL) tablet 650 mg  650 mg Oral Q6H PRN Ardis Hughs, NP   325 mg at 11/24/21 1108  ? alum & mag hydroxide-simeth (MAALOX/MYLANTA) 200-200-20 MG/5ML suspension 30 mL  30 mL Oral  Q4H PRN Ardis Hughs, NP      ? benztropine (COGENTIN) tablet 1 mg  1 mg Oral Daily Eliseo Gum B, MD      ? diphenhydrAMINE (BENADRYL) capsule 50 mg  50 mg Oral Q8H PRN Eliseo Gum B, MD      ? Or  ? diphenhydrAMINE (BENADRYL) injection 50 mg  50 mg Intramuscular Q6H PRN Eliseo Gum B, MD      ? divalproex (DEPAKOTE) DR tablet 500 mg  500 mg Oral Q12H Ardis Hughs, NP   500 mg at 11/23/21 2047  ? haloperidol (HALDOL) tablet 10 mg  10 mg Oral QHS Massengill, Harrold Donath, MD   10 mg at 11/23/21 2046  ? haloperidol (HALDOL) tablet 10 mg  10 mg Oral Daily Eliseo Gum B, MD      ? hydrOXYzine (ATARAX) tablet 25 mg  25 mg Oral TID PRN Ardis Hughs, NP   25 mg at 11/23/21 2046  ? LORazepam (ATIVAN) tablet 0.5 mg  0.5 mg Oral BID Massengill, Harrold Donath, MD   0.5 mg at 11/23/21 1723  ? LORazepam (ATIVAN) tablet 1 mg  1 mg Oral Q8H PRN Massengill, Harrold Donath, MD   1 mg at 11/24/21 0044  ? magnesium hydroxide (MILK OF MAGNESIA) suspension 30 mL  30 mL Oral Daily PRN Ardis Hughs, NP      ? OLANZapine zydis (ZYPREXA) disintegrating tablet 5 mg  5 mg Oral Q8H PRN Massengill, Harrold Donath, MD      ? And  ? ziprasidone (GEODON) injection 20 mg  20 mg Intramuscular Q8H PRN Massengill, Nathan, MD      ? propranolol (INDERAL) tablet 10 mg  10 mg Oral BID Ardis Hughs, NP   10 mg at 11/23/21 1640  ? traZODone (DESYREL) tablet 50 mg  50 mg Oral QHS,MR X 1 Melbourne Abts W, PA-C   50 mg at 11/24/21 6629  ? ? ?Lab Results: No results found for this or any previous visit (from the past 48 hour(s)). ? ?Blood Alcohol level:  ?Lab Results  ?Component Value Date  ? ETH <10 08/01/2021  ? ? ?Metabolic Disorder Labs: ?Lab Results  ?Component Value Date  ? HGBA1C 4.8 11/22/2021  ? MPG 91.06 11/22/2021  ? MPG 96.8 08/01/2021  ? ?Lab Results  ?Component Value Date  ? PROLACTIN 8.6 08/01/2021  ? ?Lab Results  ?Component Value Date  ? CHOL 213 (H) 11/18/2021  ? TRIG 24 11/18/2021  ? HDL >135 11/18/2021  ? CHOLHDL NOT CALCULATED 11/18/2021  ? VLDL 5 11/18/2021  ? LDLCALC NOT CALCULATED 11/18/2021  ? LDLCALC 68 08/01/2021  ? ? ?Physical Findings: ?AIMS: Facial and Oral Movements ?Muscles of Facial Expression: None, normal ?Lips and Perioral Area: None, normal ?Jaw: None, normal ?Tongue: None, normal,Extremity Movements ?Upper (arms, wrists, hands, fingers): None, normal ?Lower (legs, knees, ankles, toes): None, normal, Trunk Movements ?Neck, shoulders, hips: None, normal, Overall Severity ?Severity of abnormal movements (highest score from questions above): None, normal ?Incapacitation due to abnormal movements: None, normal ?Patient's awareness of abnormal movements (rate only patient's  report): No Awareness, Dental Status ?Current problems with teeth and/or dentures?: No ?Does patient usually wear dentures?: No  ?CIWA:    ?COWS:    ? ?Musculoskeletal: ?Strength & Muscle Tone: within normal limits ?Gait & Station: normal ?Patient leans: N/A ? ?Psychiatric Specialty Exam: ? ?Presentation  ?General Appearance: Casual ? ?Eye Contact:Minimal ? ?Speech:Clear and Coherent; Slow ? ?Speech Volume:Normal ? ?Handedness:Right ? ? ?Mood and Affect  ?  Mood:Euthymic ? ?Affect:Blunt; Flat ? ? ?Thought Process  ?Thought Processes:Goal Directed ? ?Descriptions of Associations:Circumstantial ? ?Orientation:Partial (was a bit confused about the date, but knew she was writing it down to keep track of the days, and her notes were correct she had 11/24/2021 as her last written day for today, but she was not sure if she was correct) ? ?Thought Content:Logical ? ?History of Schizophrenia/Schizoaffective disorder:No ? ?Duration of Psychotic Symptoms:Less than six months ? ?Hallucinations:Hallucinations: None ? ?Ideas of Reference:Paranoia ? ?Suicidal Thoughts:Suicidal Thoughts: No ? ?Homicidal Thoughts:Homicidal Thoughts: No ? ? ?Sensorium  ?Memory:Immediate Fair; Recent Good ? ?Judgment:-- (Improving) ? ?Insight:Shallow ? ? ?Executive Functions  ?Concentration:Fair ? ?Attention Span:Fair ? ?Recall:Good ? ?Fund of Knowledge:Good ? ?Language:Good ? ? ?Psychomotor Activity  ?Psychomotor Activity:Psychomotor Activity: Psychomotor Retardation ? ? ?Assets  ?Assets:Resilience; Housing; Social Support; Desire for Improvement ? ? ?Sleep  ?Sleep:Sleep: Good ?Number of Hours of Sleep: 6.5 ? ? ? ?Physical Exam: ?Physical Exam ?HENT:  ?   Head: Normocephalic and atraumatic.  ?Pulmonary:  ?   Effort: Pulmonary effort is normal.  ?Skin: ?   General: Skin is dry.  ?Neurological:  ?   Mental Status: She is alert.  ? ?Review of Systems  ?Psychiatric/Behavioral:  Negative for hallucinations and suicidal ideas.   ?Blood pressure 102/64, pulse  (!) 129, temperature 98 ?F (36.7 ?C), temperature source Oral, resp. rate 16, height 5\' 2"  (1.575 m), weight 56.7 kg, SpO2 100 %. Body mass index is 22.86 kg/m?. ? ? ?Treatment Plan Summary: ?Daily contact with

## 2021-11-25 NOTE — Progress Notes (Signed)
?   11/25/21 0100  ?Psych Admission Type (Psych Patients Only)  ?Admission Status Involuntary  ?Psychosocial Assessment  ?Patient Complaints Anxiety;Suspiciousness  ?Eye Contact Brief  ?Facial Expression Flat  ?Affect Appropriate to circumstance;Blunted;Sad;Preoccupied;Labile  ?Speech Argumentative;Tangential  ?Interaction Assertive  ?Motor Activity Restless  ?Appearance/Hygiene Unremarkable  ?Behavior Characteristics Appropriate to situation;Unwilling to participate;Agitated  ?Thought Process  ?Coherency Circumstantial;Tangential  ?Content Preoccupation  ?Delusions None reported or observed  ?Perception Derealization  ?Hallucination None reported or observed  ?Judgment Poor  ?Confusion None  ?Danger to Self  ?Current suicidal ideation? Denies  ?Danger to Others  ?Danger to Others None reported or observed  ? ? ?

## 2021-11-25 NOTE — Progress Notes (Signed)
?   11/25/21 1150  ?Psych Admission Type (Psych Patients Only)  ?Admission Status Involuntary  ?Psychosocial Assessment  ?Patient Complaints Anxiety;Suspiciousness  ?Eye Contact Brief  ?Facial Expression Flat  ?Affect Preoccupied;Irritable  ?Speech Tangential  ?Interaction Assertive  ?Motor Activity Pacing;Fidgety  ?Appearance/Hygiene In hospital gown  ?Behavior Characteristics Anxious;Fidgety  ?Mood Depressed;Irritable;Preoccupied  ?Thought Process  ?Coherency Circumstantial;Tangential  ?Content Preoccupation  ?Delusions None reported or observed  ?Perception Depersonalization  ?Hallucination None reported or observed  ?Judgment Poor  ?Confusion None  ?Danger to Self  ?Current suicidal ideation? Denies  ?Danger to Others  ?Danger to Others None reported or observed  ? ? ?

## 2021-11-25 NOTE — Progress Notes (Signed)
[facility] MD Progress Note ? ?11/25/2021 3:13 PM ?Kaitlyn Cline  ?MRN:  [id number] ?Subjective:   ? Kaitlyn Cline is a 21 yo patient w/ PPH of Bipolar disorder type 1 and PMH of migraines who presented to [facility] under IVC endorsing bizarre behavior, decreased sleep, and euphoric mood. Patient IVC continued at [facility] ?  ?Case was discussed in the multidisciplinary team. MAR was reviewed and patient was compliant with medications although this AM she required significant encouragement from staff. ? ?Medication adjustments yesterday. ?- Continue Haldol to 10mg  BID ?- Increase cogentin  to 1 mg daily ?- Increase inderal to 20mg  bid for anxiety/restlessness ?- Continue Depakote 500mg  BID ? ?Assessment today patient reports that she is no longer afraid to look out the window or in the mirror.  Patient reports that when she initially got in the hospital she felt like she was looking at a courts but no longer feels this.  Patient initially starts interview by saying that she is the "virus" however it is again clarified with patient that there were viruses in the world and that this is what she was probably recalling.  Patient endorsed understanding that there had been COVID and norovirus on the unit but that they are no longer in isolation.  Patient reported that she has been looking through her journal and has noticed that her thought process has been pretty disorganized during her hospitalization.  Patient reports that she does like to write down everything so she does not forget.  Patient endorsed that she is worried that she will not be able to advocate for herself so she is trying to record and understand her diagnosis and treatments that she can advocate for herself when she moves to misery.  Patient endorses that she is also still very worried about the dog inside of her [facility].  Again it is explained to patient there is no dog inside of her [facility] however provider has noticed that it does make a noise that sounds like a dog.  Patient  reports that she really is bothered by the sound. ? ?On assessment today patient appeared to have a brighter affect and appeared more awake and less sedated.  Although patient was initially making bizarre statements she seemed to be easily redirected and less paranoid when providers attempted to explain things for her.  Patient still has some trouble slowing her thoughts down as she asked questions before she can get answers however again she could easily be redirected and slowed down.  Discussion was held with patient regarding transitioning to an LAI on Haldol; however, patient reported that she would rather get a pillbox and does not enjoy getting shots.  Patient reported that she is open to starting the ceiling patch for birth control regarding her Depakote. ? ?Principal Problem: Bipolar I disorder, current or most recent episode manic, with psychotic features (HCC) ?Diagnosis: Principal Problem: ?  Bipolar I disorder, current or most recent episode manic, with psychotic features (HCC) ? ?Total Time spent with patient: 30 minutes ? ?Past Psychiatric History: See H&P ? ?Past Medical History: History reviewed. No pertinent past medical history. History reviewed. No pertinent surgical history. ?Family History: History reviewed. No pertinent family history. ?Family Psychiatric  History: See H&P ?Social History:  ?Social History  ? ?Substance and Sexual Activity  ?Alcohol Use Yes  ?   ?Social History  ? ?Substance and Sexual Activity  ?Drug Use Yes  ? Types: Marijuana  ?  ?Social History  ? ?Socioeconomic History  ? Marital  status: Single  ?  Spouse name: Not on file  ? Number of children: Not on file  ? Years of education: 7214  ? Highest education level: Some college, no degree  ?Occupational History  ? Not on file  ?Tobacco Use  ? Smoking status: Never  ? Smokeless tobacco: Not on file  ?Vaping Use  ? Vaping Use: Unknown  ?Substance and Sexual Activity  ? Alcohol use: Yes  ? Drug use: Yes  ?  Types: Marijuana  ?  Sexual activity: Not Currently  ?Other Topics Concern  ? Not on file  ?Social History Narrative  ? Not on file  ? ?Social Determinants of Health  ? ?Financial Resource Strain: Not on file  ?Food Insecurity: Not on file  ?Transportation Needs: Not on file  ?Physical Activity: Not on file  ?Stress: Not on file  ?Social Connections: Not on file  ? ?Additional Social History:  ?  ?  ?  ?  ?  ?  ?  ?  ?  ?  ?  ? ?Sleep: Good ? ?Appetite:  Good ? ?Current Medications: ?Current Facility-Administered Medications  ?Medication Dose Route Frequency Provider Last Rate Last Admin  ? acetaminophen (TYLENOL) tablet 650 mg  650 mg Oral Q6H PRN Ardis Hughsoleman, Carolyn H, NP   325 mg at 11/24/21 1108  ? alum & mag hydroxide-simeth (MAALOX/MYLANTA) 200-200-20 MG/5ML suspension 30 mL  30 mL Oral Q4H PRN Ardis Hughsoleman, Carolyn H, NP      ? benztropine (COGENTIN) tablet 1 mg  1 mg Oral Daily Eliseo GumMcQuilla, Mykala Mccready B, MD   1 mg at 11/25/21 [id number]  ? diphenhydrAMINE (BENADRYL) capsule 50 mg  50 mg Oral Q8H PRN Eliseo GumMcQuilla, Candiss Galeana B, MD      ? Or  ? diphenhydrAMINE (BENADRYL) injection 50 mg  50 mg Intramuscular Q6H PRN Eliseo GumMcQuilla, Wylee Dorantes B, MD      ? divalproex (DEPAKOTE) DR tablet 500 mg  500 mg Oral Q12H Vernard Gamblesoleman, Carolyn H, NP   500 mg at 11/25/21 [id number]  ? haloperidol (HALDOL) tablet 10 mg  10 mg Oral QHS Massengill, Harrold DonathNathan, MD   10 mg at 11/24/21 2050  ? haloperidol (HALDOL) tablet 10 mg  10 mg Oral Daily Eliseo GumMcQuilla, Masaichi Kracht B, MD   10 mg at 11/25/21 [id number]  ? hydrOXYzine (ATARAX) tablet 25 mg  25 mg Oral TID PRN Ardis Hughsoleman, Carolyn H, NP   25 mg at 11/24/21 2050  ? LORazepam (ATIVAN) tablet 0.5 mg  0.5 mg Oral BID Massengill, Harrold DonathNathan, MD   0.5 mg at 11/25/21 [id number]  ? LORazepam (ATIVAN) tablet 1 mg  1 mg Oral Q8H PRN Massengill, Harrold DonathNathan, MD   1 mg at 11/24/21 0044  ? magnesium hydroxide (MILK OF MAGNESIA) suspension 30 mL  30 mL Oral Daily PRN Ardis Hughsoleman, Carolyn H, NP      ? OLANZapine zydis (ZYPREXA) disintegrating tablet 5 mg  5 mg Oral Q8H PRN Massengill, Harrold DonathNathan, MD      ? And  ?  ziprasidone (GEODON) injection 20 mg  20 mg Intramuscular Q8H PRN Massengill, Nathan, MD      ? propranolol (INDERAL) tablet 20 mg  20 mg Oral BID Mariel CraftMaurer, Sheila M, MD   20 mg at 11/25/21 [id number]  ? traZODone (DESYREL) tablet 50 mg  50 mg Oral QHS,MR X 1 Melbourne Abtsaylor, Cody W, PA-C   50 mg at 11/24/21 [id number]  ? ? ?Lab Results: No results found for this or any previous visit (from the past 48 hour(s)). ? ?Blood Alcohol level:  ?  Lab Results  ?Component Value Date  ? ETH <10 08/01/2021  ? ? ?Metabolic Disorder Labs: ?Lab Results  ?Component Value Date  ? HGBA1C 4.8 11/22/2021  ? MPG 91.06 11/22/2021  ? MPG 96.8 08/01/2021  ? ?Lab Results  ?Component Value Date  ? PROLACTIN 8.6 08/01/2021  ? ?Lab Results  ?Component Value Date  ? CHOL 213 (H) 11/18/2021  ? TRIG 24 11/18/2021  ? HDL >135 11/18/2021  ? CHOLHDL NOT CALCULATED 11/18/2021  ? VLDL 5 11/18/2021  ? LDLCALC NOT CALCULATED 11/18/2021  ? LDLCALC 68 08/01/2021  ? ? ?Physical Findings: ?AIMS: Facial and Oral Movements ?Muscles of Facial Expression: None, normal ?Lips and Perioral Area: None, normal ?Jaw: None, normal ?Tongue: None, normal,Extremity Movements ?Upper (arms, wrists, hands, fingers): None, normal ?Lower (legs, knees, ankles, toes): None, normal, Trunk Movements ?Neck, shoulders, hips: None, normal, Overall Severity ?Severity of abnormal movements (highest score from questions above): None, normal ?Incapacitation due to abnormal movements: None, normal ?Patient's awareness of abnormal movements (rate only patient's report): No Awareness, Dental Status ?Current problems with teeth and/or dentures?: No ?Does patient usually wear dentures?: No  ?CIWA:    ?COWS:    ? ?Musculoskeletal: ?Strength & Muscle Tone: within normal limits ?Gait & Station: normal ?Patient leans: N/A ? ?Psychiatric Specialty Exam: ? ?Presentation  ?General Appearance: Appropriate for Environment; Casual ? ?Eye Contact:Fair ? ?Speech:Clear and Coherent ? ?Speech  Volume:Normal ? ?Handedness:Right ? ? ?Mood and Affect  ?Mood:Euthymic ? ?Affect:Appropriate; Congruent ? ? ?Thought Process  ?Thought Processes:Goal Directed ? ?Descriptions of Associations:Circumstantial ? ?Orientation:Full (Time, Place and Doristine Devoid

## 2021-11-25 NOTE — Plan of Care (Signed)
?  Problem: Coping: ?Goal: Ability to verbalize frustrations and anger appropriately will improve ?Outcome: Progressing ?  ?Problem: Coping: ?Goal: Ability to demonstrate self-control will improve ?Outcome: Progressing ?  ?Problem: Safety: ?Goal: Periods of time without injury will increase ?Outcome: Progressing ?  ?Problem: Self-Concept: ?Goal: Ability to identify factors that promote anxiety will improve ?Outcome: Progressing ?  ?

## 2021-11-25 NOTE — Group Note (Signed)
LCSW Group Therapy ? ? ?CSW group not facilitated due to unit limitations on patient proximity/interactions due to infection prevention measures. ? ?Kaitlyn Cline T [name] LCSWA  ?10:40 AM ? ?

## 2021-11-26 ENCOUNTER — Encounter (HOSPITAL_COMMUNITY): Payer: Self-pay

## 2021-11-26 LAB — COMPREHENSIVE METABOLIC PANEL
ALT: 10 U/L (ref 0–44)
AST: 12 U/L — ABNORMAL LOW (ref 15–41)
Albumin: 3.7 g/dL (ref 3.5–5.0)
Alkaline Phosphatase: 40 U/L (ref 38–126)
Anion gap: 7 (ref 5–15)
BUN: 11 mg/dL (ref 6–20)
CO2: 25 mmol/L (ref 22–32)
Calcium: 9.1 mg/dL (ref 8.9–10.3)
Chloride: 104 mmol/L (ref 98–111)
Creatinine, Ser: 0.79 mg/dL (ref 0.44–1.00)
GFR, Estimated: >60 mL/min (ref >60)
Glucose, Bld: 87 mg/dL (ref 70–99)
Potassium: 3.7 mmol/L (ref 3.5–5.1)
Sodium: 136 mmol/L (ref 135–145)
Total Bilirubin: 0.3 mg/dL (ref 0.3–1.2)
Total Protein: 7.2 g/dL (ref 6.5–8.1)

## 2021-11-26 LAB — CBC WITH DIFFERENTIAL/PLATELET
Abs Immature Granulocytes: 0.02 10*3/uL (ref 0.00–0.07)
Basophils Absolute: 0 10*3/uL (ref 0.0–0.1)
Basophils Relative: 0 %
Eosinophils Absolute: 0.3 10*3/uL (ref 0.0–0.5)
Eosinophils Relative: 5 %
HCT: 38.2 % (ref 36.0–46.0)
Hemoglobin: 12.4 g/dL (ref 12.0–15.0)
Immature Granulocytes: 0 %
Lymphocytes Relative: 47 %
Lymphs Abs: 2.6 10*3/uL (ref 0.7–4.0)
MCH: 32 pg (ref 26.0–34.0)
MCHC: 32.5 g/dL (ref 30.0–36.0)
MCV: 98.5 fL (ref 80.0–100.0)
Monocytes Absolute: 0.5 10*3/uL (ref 0.1–1.0)
Monocytes Relative: 9 %
Neutro Abs: 2.2 10*3/uL (ref 1.7–7.7)
Neutrophils Relative %: 39 %
Platelets: 235 10*3/uL (ref 150–400)
RBC: 3.88 MIL/uL (ref 3.87–5.11)
RDW: 13.2 % (ref 11.5–15.5)
WBC: 5.6 10*3/uL (ref 4.0–10.5)
nRBC: 0 % (ref 0.0–0.2)

## 2021-11-26 LAB — VALPROIC ACID LEVEL: Valproic Acid Lvl: 100 ug/mL (ref 50.0–100.0)

## 2021-11-26 NOTE — BH IP Treatment Plan (Signed)
Interdisciplinary Treatment and Diagnostic Plan Update ? ?11/26/2021 ?Time of Session: 1:05pm ?Kaitlyn Cline ?MRN: [id number] ? ?Principal Diagnosis: Bipolar I disorder, current or most recent episode manic, with psychotic features (HCC) ? ?Secondary Diagnoses: Principal Problem: ?  Bipolar I disorder, current or most recent episode manic, with psychotic features (HCC) ? ? ?Current Medications:  ?Current Facility-Administered Medications  ?Medication Dose Route Frequency Provider Last Rate Last Admin  ? acetaminophen (TYLENOL) tablet 650 mg  650 mg Oral Q6H PRN Ardis Hughs, NP   325 mg at 11/24/21 1108  ? alum & mag hydroxide-simeth (MAALOX/MYLANTA) 200-200-20 MG/5ML suspension 30 mL  30 mL Oral Q4H PRN Ardis Hughs, NP      ? benztropine (COGENTIN) tablet 1 mg  1 mg Oral Daily Eliseo Gum B, MD   1 mg at 11/26/21 7902  ? diphenhydrAMINE (BENADRYL) capsule 50 mg  50 mg Oral Q8H PRN Eliseo Gum B, MD      ? Or  ? diphenhydrAMINE (BENADRYL) injection 50 mg  50 mg Intramuscular Q6H PRN Eliseo Gum B, MD      ? divalproex (DEPAKOTE) DR tablet 500 mg  500 mg Oral Q12H Vernard Gambles H, NP   500 mg at 11/26/21 4097  ? haloperidol (HALDOL) tablet 10 mg  10 mg Oral QHS Massengill, Harrold Donath, MD   10 mg at 11/25/21 2138  ? haloperidol (HALDOL) tablet 10 mg  10 mg Oral Daily Eliseo Gum B, MD   10 mg at 11/26/21 3532  ? hydrOXYzine (ATARAX) tablet 25 mg  25 mg Oral TID PRN Ardis Hughs, NP   25 mg at 11/24/21 2050  ? LORazepam (ATIVAN) tablet 0.5 mg  0.5 mg Oral BID Massengill, Harrold Donath, MD   0.5 mg at 11/26/21 9924  ? LORazepam (ATIVAN) tablet 1 mg  1 mg Oral Q8H PRN Massengill, Harrold Donath, MD   1 mg at 11/24/21 0044  ? magnesium hydroxide (MILK OF MAGNESIA) suspension 30 mL  30 mL Oral Daily PRN Ardis Hughs, NP      ? OLANZapine zydis (ZYPREXA) disintegrating tablet 5 mg  5 mg Oral Q8H PRN Massengill, Harrold Donath, MD      ? And  ? ziprasidone (GEODON) injection 20 mg  20 mg Intramuscular Q8H PRN  Massengill, Nathan, MD      ? propranolol (INDERAL) tablet 20 mg  20 mg Oral BID Mariel Craft, MD   20 mg at 11/26/21 2683  ? traZODone (DESYREL) tablet 50 mg  50 mg Oral QHS,MR X 1 Jaclyn Shaggy, PA-C   50 mg at 11/24/21 0044  ? ?PTA Medications: ?Medications Prior to Admission  ?Medication Sig Dispense Refill Last Dose  ? [START ON 12/18/2021] ARIPiprazole Lauroxil ER (ARISTADA) 1064 MG/3.9ML prefilled syringe Inject 1,064 mg into the muscle every 8 (eight) weeks.     ? divalproex (DEPAKOTE) 500 MG DR tablet Take 1 tablet (500 mg total) by mouth every 12 (twelve) hours.     ? melatonin 5 MG TABS Take 5-10 mg by mouth at bedtime.     ? propranolol (INDERAL) 10 MG tablet Take 1 tablet (10 mg total) by mouth 2 (two) times daily.     ? traZODone (DESYREL) 50 MG tablet Take 1 tablet (50 mg total) by mouth at bedtime and may repeat dose one time if needed. 10 tablet 0   ? ? ?Patient Stressors: Financial difficulties   ?Marital or family conflict   ?Occupational concerns   ? ?Patient Strengths: Communication skills  ?  General fund of knowledge  ?Physical Health  ? ?Treatment Modalities: Medication Management, Group therapy, Case management,  ?1 to 1 session with clinician, Psychoeducation, Recreational therapy. ? ? ?Physician Treatment Plan for Primary Diagnosis: Bipolar I disorder, current or most recent episode manic, with psychotic features (HCC) ?Long Term Goal(s): Improvement in symptoms so as ready for discharge  ? ?Short Term Goals: Ability to identify changes in lifestyle to reduce recurrence of condition will improve ?Ability to verbalize feelings will improve ?Ability to disclose and discuss suicidal ideas ?Ability to demonstrate self-control will improve ?Ability to identify and develop effective coping behaviors will improve ?Ability to maintain clinical measurements within normal limits will improve ?Compliance with prescribed medications will improve ? ?Medication Management: Evaluate patient's response,  side effects, and tolerance of medication regimen. ? ?Therapeutic Interventions: 1 to 1 sessions, Unit Group sessions and Medication administration. ? ?Evaluation of Outcomes: Progressing ? ?Physician Treatment Plan for Secondary Diagnosis: Principal Problem: ?  Bipolar I disorder, current or most recent episode manic, with psychotic features (HCC) ? ?Long Term Goal(s): Improvement in symptoms so as ready for discharge  ? ?Short Term Goals: Ability to identify changes in lifestyle to reduce recurrence of condition will improve ?Ability to verbalize feelings will improve ?Ability to disclose and discuss suicidal ideas ?Ability to demonstrate self-control will improve ?Ability to identify and develop effective coping behaviors will improve ?Ability to maintain clinical measurements within normal limits will improve ?Compliance with prescribed medications will improve    ? ?Medication Management: Evaluate patient's response, side effects, and tolerance of medication regimen. ? ?Therapeutic Interventions: 1 to 1 sessions, Unit Group sessions and Medication administration. ? ?Evaluation of Outcomes: Progressing ? ? ?RN Treatment Plan for Primary Diagnosis: Bipolar I disorder, current or most recent episode manic, with psychotic features (HCC) ?Long Term Goal(s): Knowledge of disease and therapeutic regimen to maintain health will improve ? ?Short Term Goals: Ability to remain free from injury will improve, Ability to verbalize frustration and anger appropriately will improve, Ability to demonstrate self-control, Ability to participate in decision making will improve, Ability to verbalize feelings will improve, and Compliance with prescribed medications will improve ? ?Medication Management: RN will administer medications as ordered by provider, will assess and evaluate patient's response and provide education to patient for prescribed medication. RN will report any adverse and/or side effects to prescribing  provider. ? ?Therapeutic Interventions: 1 on 1 counseling sessions, Psychoeducation, Medication administration, Evaluate responses to treatment, Monitor vital signs and CBGs as ordered, Perform/monitor CIWA, COWS, AIMS and Fall Risk screenings as ordered, Perform wound care treatments as ordered. ? ?Evaluation of Outcomes: Progressing ? ? ?LCSW Treatment Plan for Primary Diagnosis: Bipolar I disorder, current or most recent episode manic, with psychotic features (HCC) ?Long Term Goal(s): Safe transition to appropriate next level of care at discharge, Engage patient in therapeutic group addressing interpersonal concerns. ? ?Short Term Goals: Engage patient in aftercare planning with referrals and resources, Increase social support, Increase ability to appropriately verbalize feelings, Increase emotional regulation, Facilitate acceptance of mental health diagnosis and concerns, and Increase skills for wellness and recovery ? ?Therapeutic Interventions: Assess for all discharge needs, 1 to 1 time with Child psychotherapistocial worker, Explore available resources and support systems, Assess for adequacy in community support network, Educate family and significant other(s) on suicide prevention, Complete Psychosocial Assessment, Interpersonal group therapy. ? ?Evaluation of Outcomes: Progressing ? ? ?Progress in Treatment: ?Attending groups: No. ?Participating in groups: No. ?Taking medication as prescribed: Yes. ?Toleration medication: Yes. ?Family/Significant other contact  made: Yes, individual(s) contacted:  Cousin  ?Patient understands diagnosis: No. ?Discussing patient identified problems/goals with staff: Yes. ?Medical problems stabilized or resolved: Yes. ?Denies suicidal/homicidal ideation: Yes. ?Issues/concerns per patient self-inventory: No. ?  ?  ?New problem(s) identified: No, Describe:  None  ?  ?New Short Term/Long Term Goal(s): medication stabilization, elimination of SI thoughts, development of comprehensive mental wellness  plan.  ?  ?Patient Goals: "To better myself and remain stable"  ?  ?Discharge Plan or Barriers: Patient is to move to [place] to live with family. Patient is to follow up with [facility] in [place].  ?  ?Reason for

## 2021-11-26 NOTE — Group Note (Signed)
Recreation Therapy Group Note ? ? ?Group Topic:Health and Wellness  ?Group Date: 11/26/2021 ?Start Time: 1005 ?End Time: [id number] ?Facilitators: Victorino Sparrow, LRT,CTRS ?Location: [place] ? ? ?Goal Area(s) Addresses:  ?Patient will define components of whole wellness. ?Patient will verbalize benefit of whole wellness. ? ?Group Description:  Exercise/Trivia.  LRT and patients went over the importance of proper wellness.  LRT and patients focused on two of the elements that make up wellness, physical and mental.  Patients took turns leading group is stretches and exercises of their choosing.  The group then took turns asking trivia questions dealing with various topics such as science, technology, movies, music and sports. ? ? ?Affect/Mood: Appropriate ?  ?Participation Level: Engaged ?  ?Participation Quality: Independent ?  ?Behavior: Appropriate ?  ?Speech/Thought Process: Focused ?  ?Insight: Moderate ?  ?Judgement: Moderate ?  ?Modes of Intervention: Music and Wellness ?  ?Patient Response to Interventions:  Engaged ?  ?Education Outcome: ? Acknowledges education and In group clarification offered   ? ?Clinical Observations/Individualized Feedback: Pt was appropriate and on task.  Pt was able to lead group in exercises such as jumping jacks and push ups.  Pt was able to answer quite a few of the questions presented in the trivia as well as ask some questions of her own.  Pt was engaged during activity.  Pt left early and did not return.   ? ? ?Plan: Continue to engage patient in RT group sessions 2-3x/week. ? ? ?Victorino Sparrow, LRT,CTRS ?11/26/2021 1:25 PM ?

## 2021-11-26 NOTE — Progress Notes (Signed)
Pt continues to be suspicious, paranoid on interactions. Required multiple prompts to take her medications this shift. Denies SI, HI, AVH and pain but remains preoccupied and continues to ruminate about events leading to admission. Visible in dayroom at intervals and attended scheduled groups. Support and encouragement provided to pt throughout this shift. Safety checks maintained at Q 15 minutes intervals without self harm gestures. EKG done this shift. Tolerates meals, medications and fluids well without discomfort.  ? ?
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
## 2021-11-26 NOTE — Progress Notes (Signed)
?  Patient has been observed up in the dayroom watching tv and interacting with select peers. She was compliant with her medications. She is worried about her moving to [place] after discharged.  ? ? 11/25/21 2140  ?Psych Admission Type (Psych Patients Only)  ?Admission Status Involuntary  ?Psychosocial Assessment  ?Patient Complaints Worrying  ?Eye Contact Brief  ?Facial Expression Flat  ?Affect Appropriate to circumstance;Blunted;Sad;Preoccupied;Labile  ?Speech Argumentative;Tangential  ?Interaction Assertive  ?Motor Activity Restless  ?Appearance/Hygiene Unremarkable  ?Behavior Characteristics Cooperative;Appropriate to situation  ?Mood Preoccupied;Labile  ?Thought Process  ?Coherency Circumstantial;Tangential  ?Content Preoccupation  ?Delusions None reported or observed  ?Perception Derealization  ?Hallucination None reported or observed  ?Judgment Poor  ?Confusion None  ?Danger to Self  ?Current suicidal ideation? Denies  ?Danger to Others  ?Danger to Others None reported or observed  ? ? ?

## 2021-11-26 NOTE — Progress Notes (Signed)
Adult Psychoeducational Group Note ? ?Date:  11/26/2021 ?Time:  8:29 PM ? ?Group Topic/Focus:  ?Wrap-Up Group:   The focus of this group is to help patients review their daily goal of treatment and discuss progress on daily workbooks. ? ?Participation Level:  Did Not Attend ? ?Participation Quality:   Did Not Attend ? ?Affect:   Did Not Attend ? ?Cognitive:   Did Not Attend ? ?Insight: None ? ?Engagement in Group:  Did Not Attend ? ?Modes of Intervention:   Did Not Attend ? ?Additional Comments:  Pt was encouraged to attend wrap up group but did not attend. ? ?Felipa Furnace ?11/26/2021, 8:29 PM ?

## 2021-11-26 NOTE — Group Note (Signed)
LCSW Therapy Group Note ?  ?Group Date: 11/24/2021 ?Start Time: 11:30am ?End Time: 12:00pm ?  ?  ?Type of Therapy and Topic:  Group Therapy: Circle of Control ?  ?Participation Level:  Active ?  ?Description of Group:   Due to the acuity on the unit, staff recommended no close contact at this time so group was not held.  Patient was provided with written information and worksheets about circle of control.  CSW could not go over information with the patient, as she was on the phone. ?  ?  ?Berdell Hostetler MSW, LCSW ?Clincal Social Worker  ?[facility]  ?

## 2021-11-27 NOTE — Progress Notes (Signed)
?   11/27/21 1953  ?Psych Admission Type (Psych Patients Only)  ?Admission Status Involuntary  ?Psychosocial Assessment  ?Patient Complaints Anxiety;Suspiciousness  ?Eye Contact Brief  ?Facial Expression Anxious;Sad  ?Affect Preoccupied  ?Speech Tangential  ?Interaction Avoidant;Cautious  ?Motor Activity Slow  ?Appearance/Hygiene Disheveled  ?Behavior Characteristics Cooperative  ?Mood Suspicious;Preoccupied  ?Aggressive Behavior  ?Effect No apparent injury  ?Thought Process  ?Coherency Tangential;Blocking  ?Content Preoccupation  ?Delusions Paranoid  ?Perception WDL  ?Hallucination None reported or observed  ?Judgment Poor  ?Confusion WDL  ?Danger to Self  ?Current suicidal ideation? Denies  ?Danger to Others  ?Danger to Others None reported or observed  ? ? ?

## 2021-11-27 NOTE — Progress Notes (Signed)
Pt presents animated with fair eye contact, ambulatory with slow and steady gait. Denies SI, HI, AVh and pain when assessed. Remains suspicious, tangential, delusional and paranoid on interactions. Believes she's here voluntary and is willing to sue [facility] staff and providers for "giving me all these medications that I don't need. I am going to call my lawyer so y'all can get me out of here. I'm fine, I just need to leave". Isolative to room at intervals during this shift. Shower, changed her clothes. Reports she slept well with good appetite. Required multiple verbal prompts / encouragement to take her evening medications after refusing on 2 initial approaches. Did not attend scheduled groups despite multiple prompts. Support and reassurance provided to pt. Verbal education provided on all medications administered and effects monitored. Safety checks maintained at Q 15 minutes intervals. Pt encouraged to voice concerns. Tolerates meals, medications and fluids well without discomfort. Safety maintained in milieu.  ?

## 2021-11-27 NOTE — Group Note (Signed)
Recreation Therapy Group Note ? ? ?Group Topic:Coping Skills  ?Group Date: 11/27/2021 ?Start Time: 1000 ?End Time: 1037 ?Facilitators: Caroll Rancher, LRT,CTRS ?Location: [address] Dayroom ? ? ?Goal Area(s) Addresses:  ?Patient will identify positive coping skill strategies. ?Patient will identify benefits of using coping skills post d/c. ? ?Group Description:  Mind Map.  Patient was provided a blank template of a diagram with 32 blank boxes in a tiered system, branching from the center (similar to a bubble chart). LRT directed patients to label the middle of the diagram "Coping Skills" and consider 8 different challenges in which coping skills would be needed. Patients were directed to record those 8 (anger, anxiety, depression, fear, nervousness, overwhelmed, aggravated and finances)  in the 2nd tier boxes closest to the center. Patients were to then come up with 3 coping skills for each area identified.  LRT would then write patient responses on the board so patients could fill in any blank spaces on their diagram.  ? ? ?Affect/Mood: N/A ?  ?Participation Level: Did not attend ?  ? ?Clinical Observations/Individualized Feedback:   ? ? ?Plan: Continue to engage patient in RT group sessions 2-3x/week. ? ? ?Caroll Rancher, LRT,CTRS ?11/27/2021 12:03 PM ?

## 2021-11-27 NOTE — Progress Notes (Signed)
[facility] MD Progress Note ? ?11/27/2021 6:29 PM ?Kaitlyn Cline  ?MRN:  [id number] ?Subjective:   ?Kaitlyn Cline is a 21 yo patient w/ PPH of Bipolar disorder type 1 and PMH of migraines who presented to [facility] under IVC endorsing bizarre behavior, decreased sleep, and euphoric mood. Patient IVC continued at [facility]. ?  ?Case was discussed in the multidisciplinary team. MAR was reviewed and patient was compliant with medications although this AM she required significant encouragement from staff. ?  ?Medication adjustments yesterday. ?- Continue Haldol 10mg  BID ?-Continue cogentin 1 mg daily ?-continue inderal 20mg  bid for anxiety/restlessness ?- Continue Depakote 500mg  BID ?            Depakote lvl , CBC, CMP- 4/2 ?-Recommend patient patch for birth control outpatient, not available formulary and patient ?  ? ?On assessment this a.m. patient reports she was up most of the night because she was scared of the dark and what was in it. She had breakfast, but the was asleep most of the day. She was also very afraid of another patient on the unit. She is less worried about still being on the schedule at [facility]. She wasn't as scared at her home because she had a pastor bless the house when she was a kid. She asked if her room could be saged because that is her belief system now. We talk about her beliefs and her poetry and this is more calming to her. She is not having hallucinations but is able to talk about what she had been seeing. She isn't having any pain or GI issues today. No thoughts of harm to herself or others.  ? ?Principal Problem: Bipolar I disorder, current or most recent episode manic, with psychotic features (HCC) ?Diagnosis: Principal Problem: ?  Bipolar I disorder, current or most recent episode manic, with psychotic features (HCC) ? ?Total Time spent with patient: 30 minutes ? ?Past Psychiatric History: See H&P ? ?Past Medical History: History reviewed. No pertinent past medical history. History reviewed. No pertinent  surgical history. ? ?Family History: History reviewed. No pertinent family history. ? ?Family Psychiatric  History: See H&P ? ?Social History:  ?Social History  ? ?Substance and Sexual Activity  ?Alcohol Use Yes  ?   ?Social History  ? ?Substance and Sexual Activity  ?Drug Use Yes  ? Types: Marijuana  ?  ?Social History  ? ?Socioeconomic History  ? Marital status: Single  ?  Spouse name: Not on file  ? Number of children: Not on file  ? Years of education: 85  ? Highest education level: Some college, no degree  ?Occupational History  ? Not on file  ?Tobacco Use  ? Smoking status: Never  ? Smokeless tobacco: Not on file  ?Vaping Use  ? Vaping Use: Unknown  ?Substance and Sexual Activity  ? Alcohol use: Yes  ? Drug use: Yes  ?  Types: Marijuana  ? Sexual activity: Not Currently  ?Other Topics Concern  ? Not on file  ?Social History Narrative  ? Not on file  ? ?Social Determinants of Health  ? ?Financial Resource Strain: Not on file  ?Food Insecurity: Not on file  ?Transportation Needs: Not on file  ?Physical Activity: Not on file  ?Stress: Not on file  ?Social Connections: Not on file  ? ? ? ?Sleep: Good ? ?Appetite:  Good ? ?Current Medications: ?Current Facility-Administered Medications  ?Medication Dose Route Frequency Provider Last Rate Last Admin  ? acetaminophen (TYLENOL) tablet 650 mg  650 mg  Oral Q6H PRN Ardis Hughsoleman, Carolyn H, NP   325 mg at 11/24/21 1108  ? alum & mag hydroxide-simeth (MAALOX/MYLANTA) 200-200-20 MG/5ML suspension 30 mL  30 mL Oral Q4H PRN Ardis Hughsoleman, Carolyn H, NP      ? benztropine (COGENTIN) tablet 1 mg  1 mg Oral Daily Eliseo GumMcQuilla, Jai B, MD   1 mg at 11/27/21 [id number]  ? diphenhydrAMINE (BENADRYL) capsule 50 mg  50 mg Oral Q8H PRN Eliseo GumMcQuilla, Jai B, MD      ? Or  ? diphenhydrAMINE (BENADRYL) injection 50 mg  50 mg Intramuscular Q6H PRN Eliseo GumMcQuilla, Jai B, MD      ? divalproex (DEPAKOTE) DR tablet 500 mg  500 mg Oral Q12H Vernard Gamblesoleman, Carolyn H, NP   500 mg at 11/27/21 [id number]  ? haloperidol (HALDOL) tablet 10 mg   10 mg Oral QHS Massengill, Harrold DonathNathan, MD   10 mg at 11/26/21 2108  ? haloperidol (HALDOL) tablet 10 mg  10 mg Oral Daily Eliseo GumMcQuilla, Jai B, MD   10 mg at 11/27/21 0813  ? hydrOXYzine (ATARAX) tablet 25 mg  25 mg Oral TID PRN Ardis Hughsoleman, Carolyn H, NP   25 mg at 11/24/21 2050  ? LORazepam (ATIVAN) tablet 0.5 mg  0.5 mg Oral BID Massengill, Harrold DonathNathan, MD   0.5 mg at 11/27/21 1756  ? LORazepam (ATIVAN) tablet 1 mg  1 mg Oral Q8H PRN Massengill, Harrold DonathNathan, MD   1 mg at 11/24/21 0044  ? magnesium hydroxide (MILK OF MAGNESIA) suspension 30 mL  30 mL Oral Daily PRN Ardis Hughsoleman, Carolyn H, NP      ? OLANZapine zydis (ZYPREXA) disintegrating tablet 5 mg  5 mg Oral Q8H PRN Massengill, Harrold DonathNathan, MD      ? And  ? ziprasidone (GEODON) injection 20 mg  20 mg Intramuscular Q8H PRN Massengill, Harrold DonathNathan, MD      ? propranolol (INDERAL) tablet 20 mg  20 mg Oral BID Bartholomew CrewsSingleton, Amy E, MD   20 mg at 11/27/21 1756  ? traZODone (DESYREL) tablet 50 mg  50 mg Oral QHS,MR X 1 Jaclyn Shaggyaylor, Cody W, PA-C   50 mg at 11/26/21 2109  ? ? ?Lab Results:  ?Results for orders placed or performed during the hospital encounter of 11/20/21 (from the past 48 hour(s))  ?Valproic acid level     Status: None  ? Collection Time: 11/26/21  6:48 AM  ?Result Value Ref Range  ? Valproic Acid Lvl 100 50.0 - 100.0 ug/mL  ?  Comment: Performed at [facility], [address] [place], [place] [address]  ?Comprehensive metabolic panel     Status: Abnormal  ? Collection Time: 11/26/21  6:48 AM  ?Result Value Ref Range  ? Sodium 136 135 - 145 mmol/L  ? Potassium 3.7 3.5 - 5.1 mmol/L  ? Chloride 104 98 - 111 mmol/L  ? CO2 25 22 - 32 mmol/L  ? Glucose, Bld 87 70 - 99 mg/dL  ?  Comment: Glucose reference range applies only to samples taken after fasting for at least 8 hours.  ? BUN 11 6 - 20 mg/dL  ? Creatinine, Ser 0.79 0.44 - 1.00 mg/dL  ? Calcium 9.1 8.9 - 10.3 mg/dL  ? Total Protein 7.2 6.5 - 8.1 g/dL  ? Albumin 3.7 3.5 - 5.0 g/dL  ? AST 12 (L) 15 - 41 U/L  ? ALT 10 0 - 44 U/L   ? Alkaline Phosphatase 40 38 - 126 U/L  ? Total Bilirubin 0.3 0.3 - 1.2 mg/dL  ?  GFR, Estimated >60 >60 mL/min  ?  Comment: (NOTE) ?Calculated using the CKD-EPI Creatinine Equation (2021) ?  ? Anion gap 7 5 - 15  ?  Comment: Performed at [facility], [address], [place], [place] [address]  ?CBC with Differential/Platelet     Status: None  ? Collection Time: 11/26/21  6:48 AM  ?Result Value Ref Range  ? WBC 5.6 4.0 - 10.5 K/uL  ? RBC 3.88 3.87 - 5.11 MIL/uL  ? Hemoglobin 12.4 12.0 - 15.0 g/dL  ? HCT 38.2 36.0 - 46.0 %  ? MCV 98.5 80.0 - 100.0 fL  ? MCH 32.0 26.0 - 34.0 pg  ? MCHC 32.5 30.0 - 36.0 g/dL  ? RDW 13.2 11.5 - 15.5 %  ? Platelets 235 150 - 400 K/uL  ? nRBC 0.0 0.0 - 0.2 %  ? Neutrophils Relative % 39 %  ? Neutro Abs 2.2 1.7 - 7.7 K/uL  ? Lymphocytes Relative 47 %  ? Lymphs Abs 2.6 0.7 - 4.0 K/uL  ? Monocytes Relative 9 %  ? Monocytes Absolute 0.5 0.1 - 1.0 K/uL  ? Eosinophils Relative 5 %  ? Eosinophils Absolute 0.3 0.0 - 0.5 K/uL  ? Basophils Relative 0 %  ? Basophils Absolute 0.0 0.0 - 0.1 K/uL  ? Immature Granulocytes 0 %  ? Abs Immature Granulocytes 0.02 0.00 - 0.07 K/uL  ?  Comment: Performed at [facility], [address] [place], [place] [address]  ? ? ?Blood Alcohol level:  ?Lab Results  ?Component Value Date  ? ETH <10 08/01/2021  ? ? ?Metabolic Disorder Labs: ?Lab Results  ?Component Value Date  ? HGBA1C 4.8 11/22/2021  ? MPG 91.06 11/22/2021  ? MPG 96.8 08/01/2021  ? ?Lab Results  ?Component Value Date  ? PROLACTIN 8.6 08/01/2021  ? ?Lab Results  ?Component Value Date  ? CHOL 213 (H) 11/18/2021  ? TRIG 24 11/18/2021  ? HDL >135 11/18/2021  ? CHOLHDL NOT CALCULATED 11/18/2021  ? VLDL 5 11/18/2021  ? LDLCALC NOT CALCULATED 11/18/2021  ? LDLCALC 68 08/01/2021  ? ? ?Physical Findings: ?AIMS: Facial and Oral Movements ?Muscles of Facial Expression: None, normal ?Lips and Perioral Area: None, normal ?Jaw: None, normal ?Tongue: None, normal,Extremity  Movements ?Upper (arms, wrists, hands, fingers): None, normal ?Lower (legs, knees, ankles, toes): None, normal, Trunk Movements ?Neck, shoulders, hips: None, normal, Overall Severity ?Severity of abnormal mo

## 2021-11-28 NOTE — Group Note (Signed)
LCSW Group Therapy Note ?  ?  ?Group Date: 11/28/2021 ?Start Time: 1300 ?End Time: 1400 ?  ?Type of Therapy and Topic:  Group Therapy: Self-Esteem  ?  ?Participation Level:  Did not attend ?  ?Description of Group:   Due to infectious disease and illness on the unit, staff recommended no close contact at this time so group was not held.  Patient was provided with written information and worksheets about Self-Esteem.  Pt was sleeping and would not wake to discuss packet. ? ? ? ?Alphonsa Brickle MSW, LCSW ?Clincal Social Worker  ?[facility]  ?

## 2021-11-28 NOTE — Progress Notes (Signed)
?   11/28/21 0500  ?Sleep  ?Number of Hours 8.5  ? ? ?

## 2021-11-28 NOTE — Progress Notes (Signed)
[facility] MD Progress Note ? ?11/28/2021 6:26 PM ?Kaitlyn Cline  ?MRN:  [id number] ?Subjective:   ?Kaitlyn Cline is a 21 yo patient w/ PPH of Bipolar disorder type 1 and PMH of migraines who presented to [facility] under IVC endorsing bizarre behavior, decreased sleep, and euphoric mood. Patient IVC continued at [facility]. ?  ? ?On assessment this AM patient reports she is sleeping very well and that her mood is "alright." Patient reports she is also eating well. Patient reports that she has started to become fearful of looking at herself in the mirror again. Patient reports she believes her eyes appear sunken "like a decayed person." Patient provides the anecdote about watching her cousin die of  cancer and describes their cachetic appearance. Patient reports that she has been thinking about this image and she reminds herself sometimes of that cousin. Patient reports that she is also a bit worried (paranoid) about when she leaves her room and getting in trouble. Patient endorses that this is why she has been staying in her room more. Patient denies SI, HI, and AVH.  ? ?Patient shows provider her journal and her thoughts appear tangential based on yesterday entry. Patient continues to write poems but they appear more poetic and less disorganized than before.  ? ? ?Principal Problem: Bipolar I disorder, current or most recent episode manic, with psychotic features ([place]) ?Diagnosis: Principal Problem: ?  Bipolar I disorder, current or most recent episode manic, with psychotic features ([place]) ? ?Total Time spent with patient: 20 minutes ? ?Past Psychiatric History:  See H&P ? ?Past Medical History: History reviewed. No pertinent past medical history. History reviewed. No pertinent surgical history. ?Family History: History reviewed. No pertinent family history. ?Family Psychiatric  History:  See H&P ?Social History:  ?Social History  ? ?Substance and Sexual Activity  ?Alcohol Use Yes  ?   ?Social History  ? ?Substance and Sexual Activity   ?Drug Use Yes  ? Types: Marijuana  ?  ?Social History  ? ?Socioeconomic History  ? Marital status: Single  ?  Spouse name: Not on file  ? Number of children: Not on file  ? Years of education: 41  ? Highest education level: Some college, no degree  ?Occupational History  ? Not on file  ?Tobacco Use  ? Smoking status: Never  ? Smokeless tobacco: Not on file  ?Vaping Use  ? Vaping Use: Unknown  ?Substance and Sexual Activity  ? Alcohol use: Yes  ? Drug use: Yes  ?  Types: Marijuana  ? Sexual activity: Not Currently  ?Other Topics Concern  ? Not on file  ?Social History Narrative  ? Not on file  ? ?Social Determinants of Health  ? ?Financial Resource Strain: Not on file  ?Food Insecurity: Not on file  ?Transportation Needs: Not on file  ?Physical Activity: Not on file  ?Stress: Not on file  ?Social Connections: Not on file  ? ?Additional Social History:  ?  ?  ?  ?  ?  ?  ?  ?  ?  ?  ?  ? ?Sleep: Good ? ?Appetite:  Good ? ?Current Medications: ?Current Facility-Administered Medications  ?Medication Dose Route Frequency Provider Last Rate Last Admin  ? acetaminophen (TYLENOL) tablet 650 mg  650 mg Oral Q6H PRN Revonda Humphrey, NP   325 mg at 11/24/21 1108  ? alum & mag hydroxide-simeth (MAALOX/MYLANTA) 200-200-20 MG/5ML suspension 30 mL  30 mL Oral Q4H PRN Revonda Humphrey, NP      ?  benztropine (COGENTIN) tablet 1 mg  1 mg Oral Daily Damita Dunnings B, MD   1 mg at 11/28/21 0744  ? diphenhydrAMINE (BENADRYL) capsule 50 mg  50 mg Oral Q8H PRN Damita Dunnings B, MD      ? Or  ? diphenhydrAMINE (BENADRYL) injection 50 mg  50 mg Intramuscular Q6H PRN Damita Dunnings B, MD      ? divalproex (DEPAKOTE) DR tablet 500 mg  500 mg Oral Q12H Thomes Lolling H, NP   500 mg at 11/28/21 [id number]  ? haloperidol (HALDOL) tablet 10 mg  10 mg Oral QHS Massengill, Ovid Curd, MD   10 mg at 11/27/21 2029  ? haloperidol (HALDOL) tablet 10 mg  10 mg Oral Daily Damita Dunnings B, MD   10 mg at 11/28/21 0744  ? hydrOXYzine (ATARAX) tablet 25 mg  25 mg  Oral TID PRN Revonda Humphrey, NP   25 mg at 11/24/21 2050  ? LORazepam (ATIVAN) tablet 0.5 mg  0.5 mg Oral BID Massengill, Ovid Curd, MD   0.5 mg at 11/28/21 1623  ? LORazepam (ATIVAN) tablet 1 mg  1 mg Oral Q8H PRN Massengill, Ovid Curd, MD   1 mg at 11/24/21 0044  ? magnesium hydroxide (MILK OF MAGNESIA) suspension 30 mL  30 mL Oral Daily PRN Revonda Humphrey, NP      ? OLANZapine zydis (ZYPREXA) disintegrating tablet 5 mg  5 mg Oral Q8H PRN Massengill, Ovid Curd, MD      ? And  ? ziprasidone (GEODON) injection 20 mg  20 mg Intramuscular Q8H PRN Massengill, Ovid Curd, MD      ? propranolol (INDERAL) tablet 20 mg  20 mg Oral BID Harlow Asa, MD   20 mg at 11/28/21 0744  ? traZODone (DESYREL) tablet 50 mg  50 mg Oral QHS,MR X 1 Prescilla Sours, PA-C   50 mg at 11/27/21 2029  ? ? ?Lab Results: No results found for this or any previous visit (from the past 48 hour(s)). ? ?Blood Alcohol level:  ?Lab Results  ?Component Value Date  ? ETH <10 08/01/2021  ? ? ?Metabolic Disorder Labs: ?Lab Results  ?Component Value Date  ? HGBA1C 4.8 11/22/2021  ? MPG 91.06 11/22/2021  ? MPG 96.8 08/01/2021  ? ?Lab Results  ?Component Value Date  ? PROLACTIN 8.6 08/01/2021  ? ?Lab Results  ?Component Value Date  ? CHOL 213 (H) 11/18/2021  ? TRIG 24 11/18/2021  ? HDL >135 11/18/2021  ? CHOLHDL NOT CALCULATED 11/18/2021  ? VLDL 5 11/18/2021  ? [place] NOT CALCULATED 11/18/2021  ? [place] 68 08/01/2021  ? ? ?Physical Findings: ?AIMS: Facial and Oral Movements ?Muscles of Facial Expression: None, normal ?Lips and Perioral Area: None, normal ?Jaw: None, normal ?Tongue: None, normal,Extremity Movements ?Upper (arms, wrists, hands, fingers): None, normal ?Lower (legs, knees, ankles, toes): None, normal, Trunk Movements ?Neck, shoulders, hips: None, normal, Overall Severity ?Severity of abnormal movements (highest score from questions above): None, normal ?Incapacitation due to abnormal movements: None, normal ?Patient's awareness of abnormal  movements (rate only patient's report): No Awareness, Dental Status ?Current problems with teeth and/or dentures?: No ?Does patient usually wear dentures?: No  ?CIWA:    ?COWS:    ? ?Musculoskeletal: ?Strength & Muscle Tone: within normal limits ?Gait & Station: normal ?Patient leans: N/A ? ?Psychiatric Specialty Exam: ? ?Presentation  ?General Appearance: Appropriate for Environment; Casual ? ?Eye Contact:Fleeting ? ?Speech:Clear and Coherent; Slow ? ?Speech Volume:Normal ? ?Handedness:Right ? ? ?Mood and Affect  ?Mood:-- ("alright") ? ?  Affect:Blunt ? ? ?Thought Process  ?Thought Processes:Goal Directed ? ?Descriptions of Associations:Tangential ? ?Orientation:Full (Time, Place and Person) ? ?Thought Content:Illogical; Paranoid Ideation ? ?History of Schizophrenia/Schizoaffective disorder:No ? ?Duration of Psychotic Symptoms:Less than six months ? ?Hallucinations:Hallucinations: None ? ?Ideas of Reference:Paranoia ? ?Suicidal Thoughts:Suicidal Thoughts: No ? ?Homicidal Thoughts:Homicidal Thoughts: No ? ? ?Sensorium  ?Memory:Immediate Fair; Recent Fair ? ?Judgment:-- (Improving) ? ?Insight:Shallow ? ? ?Executive Functions  ?Concentration:Fair ? ?Attention Span:Fair ? ?Recall:Poor ? ?Fund of [place] ? ?Language:Good ? ? ?Psychomotor Activity  ?Psychomotor Activity:Psychomotor Activity: Decreased ? ? ?Assets  ?Assets:Resilience; Armed forces logistics/support/administrative officer; Desire for Improvement ? ? ?Sleep  ?Sleep:Sleep: Good ? ? ? ?Physical Exam: ?Physical Exam ?HENT:  ?   Head: Normocephalic and atraumatic.  ?Pulmonary:  ?   Effort: Pulmonary effort is normal.  ?Neurological:  ?   Mental Status: She is alert and oriented to person, place, and time.  ? ?Review of Systems  ?Psychiatric/Behavioral:  Negative for hallucinations and suicidal ideas. The patient is nervous/anxious. The patient does not have insomnia.   ?Blood pressure 98/60, pulse 71, temperature 98.5 ?F (36.9 ?C), temperature source Oral, resp. rate 16, height 5\' 2"   (1.575 m), weight 56.7 kg, SpO2 99 %. Body mass index is 22.86 kg/m?. ? ? ?Treatment Plan Summary: ?Daily contact with patient to assess and evaluate symptoms and progress in treatment and Medication management ?

## 2021-11-28 NOTE — Plan of Care (Signed)
?  Problem: Coping: ?Goal: Ability to verbalize frustrations and anger appropriately will improve ?Outcome: Progressing ?  ?Problem: Coping: ?Goal: Ability to demonstrate self-control will improve ?Outcome: Progressing ?  ?Problem: Safety: ?Goal: Periods of time without injury will increase ?Outcome: Progressing ?  ?Problem: Education: ?Goal: Ability to state activities that reduce stress will improve ?Outcome: Progressing ?  ?

## 2021-11-28 NOTE — Progress Notes (Signed)
?   11/28/21 2000  ?Psych Admission Type (Psych Patients Only)  ?Admission Status Involuntary  ?Psychosocial Assessment  ?Patient Complaints Anxiety;Suspiciousness  ?Eye Contact Brief  ?Facial Expression Anxious;Sad  ?Affect Preoccupied  ?Speech Tangential  ?Interaction Avoidant;Cautious  ?Motor Activity Slow  ?Appearance/Hygiene Disheveled  ?Behavior Characteristics Cooperative  ?Mood Preoccupied;Suspicious  ?Aggressive Behavior  ?Effect No apparent injury  ?Thought Process  ?Coherency Tangential;Blocking  ?Content Preoccupation  ?Delusions Paranoid  ?Perception WDL  ?Hallucination None reported or observed  ?Judgment Poor  ?Confusion WDL  ?Danger to Self  ?Current suicidal ideation? Denies  ?Danger to Others  ?Danger to Others None reported or observed  ? ? ?

## 2021-11-28 NOTE — Progress Notes (Signed)
?   11/28/21 1124  ?Psych Admission Type (Psych Patients Only)  ?Admission Status Involuntary  ?Psychosocial Assessment  ?Patient Complaints Anxiety;Suspiciousness  ?Eye Contact Fair  ?Facial Expression Animated  ?Affect Preoccupied  ?Speech Tangential  ?Interaction Cautious;Minimal  ?Motor Activity Slow  ?Appearance/Hygiene Disheveled  ?Behavior Characteristics Cooperative  ?Mood Suspicious;Preoccupied  ?Thought Process  ?Coherency Tangential  ?Content Preoccupation  ?Delusions Paranoid  ?Perception WDL  ?Hallucination None reported or observed  ?Judgment Poor  ?Confusion None  ?Danger to Self  ?Current suicidal ideation? Denies  ?Danger to Others  ?Danger to Others None reported or observed  ? ? ?

## 2021-11-28 NOTE — Group Note (Signed)
Recreation Therapy Group Note ? ? ?Group Topic:Leisure Education  ?Group Date: 11/28/2021 ?Start Time: 1005 ?End Time: 1040 ?Facilitators: Caroll Rancher, LRT,CTRS ?Location: [address] Dayroom ? ? ?Goal Area(s) Addresses:  ?Patient will successfully identify positive leisure and recreation activities.  ?Patient will acknowledge benefits of participation in healthy leisure activities post discharge.  ?Patient will actively work with peers toward a shared goal. ?  ?Group Description: Pictionary. In groups, patients took turns trying to guess the picture being drawn on the board by their peers.  If someone guessed the correct answer, they got the next turn.  After several rounds of game play, the group would discuss the importance of leisure in everyday life. Post-activity discussion reviewed benefits of positive recreation outlets: reducing stress, improving coping mechanisms, increasing self-esteem, and building larger support systems. ? ? ?Affect/Mood: Appropriate ?  ?Participation Level: Active ?  ?Participation Quality: Independent ?  ?Behavior: Appropriate ?  ?Speech/Thought Process: Rational ?  ?Insight: Good ?  ?Judgement: Good ?  ?Modes of Intervention: Competitive Play ?  ?Patient Response to Interventions:  Attentive ?  ?Education Outcome: ? Acknowledges education and In group clarification offered   ? ?Clinical Observations/Individualized Feedback: Pt came for the last 10 minutes of group.  Pt was social and engaged with peers.  Pt was attentive and participated in activity with minimal prompting.   ? ? ?Plan: Continue to engage patient in RT group sessions 2-3x/week. ? ? ?Caroll Rancher, LRT,CTRS ?11/28/2021 1:04 PM ?

## 2021-11-29 NOTE — Progress Notes (Signed)
[facility] ? ?11/29/2021 1:21 PM ?Kaitlyn Cline  ?MRN:  [id number] ?Subjective:   ?Kaitlyn Cline is a 21 yo patient w/ PPH of Bipolar disorder type 1 and PMH of migraines who [facility] endorsing bizarre behavior, decreased sleep, and euphoric mood. [facility]. ?  ?On assessment this AM patient reports that she is sleeping well and eating well. Patient reports that she is not having any problems looking at her self in the mirror. Patient also shows provider how she likes to take notes. Patient is currently writing down bible passages off of cards she has and recalls that when she first came to the hospital she kept the bible passages in the window with her unicorn shaped bubble pop toy. Patient reports she had done this as her protection, but she feels safe in the hospital now and denies feeling worried or anxious about anything. Patient denies feeling like their are weird patterns around her. Patient reports she [facility] [employer] [facility] and she has been writing the names of books she wants to read later and has also been writing notes from handouts for coping skills. Patient denies SI, HI, and AVH. Patient reports she is "doing good." ? ?Principal Problem: Bipolar I disorder, current or most recent episode manic, with psychotic features (HCC) ?Diagnosis: Principal Problem: ?  Bipolar I disorder, current or most recent episode manic, with psychotic features (HCC) ? ?Total Time spent with patient: 20 minutes ? ?Past Psychiatric History: See H&P ? ?Past Medical History: History reviewed. No pertinent past medical history. History reviewed. No pertinent surgical history. ?Family History: History reviewed. No pertinent family history. ?Family Psychiatric  History:  See H&P ?Social History:  ?Social History  ? ?Substance and Sexual Activity  ?Alcohol Use Yes  ?   ?Social History  ? ?Substance and Sexual Activity  ?Drug Use Yes  ? Types: Marijuana  ?  ?Social History   ? ?Socioeconomic History  ? Marital status: Single  ?  Spouse name: Not on file  ? Number of children: Not on file  ? Years of education: 2914  ? Highest education level: Some college, no degree  ?Occupational History  ? Not on file  ?Tobacco Use  ? Smoking status: Never  ? Smokeless tobacco: Not on file  ?Vaping Use  ? Vaping Use: Unknown  ?Substance and Sexual Activity  ? Alcohol use: Yes  ? Drug use: Yes  ?  Types: Marijuana  ? Sexual activity: Not Currently  ?Other Topics Concern  ? Not on file  ?Social History Narrative  ? Not on file  ? ?Social Determinants of Health  ? ?Financial Resource Strain: Not on file  ?Food Insecurity: Not on file  ?Transportation Needs: Not on file  ?Physical Activity: Not on file  ?Stress: Not on file  ?Social Connections: Not on file  ? ?Additional Social History:  ?  ?  ?  ?  ?  ?  ?  ?  ?  ?  ?  ? ?Sleep: Good ? ?Appetite:  Good ? ?Current Medications: ?Current Facility-Administered Medications  ?Medication Dose Route Frequency Provider Last Rate Last Admin  ? acetaminophen (TYLENOL) tablet 650 mg  650 mg Oral Q6H PRN Ardis Hughsoleman, Carolyn H, NP   325 mg at 11/24/21 1108  ? alum & mag hydroxide-simeth (MAALOX/MYLANTA) 200-200-20 MG/5ML suspension 30 mL  30 mL Oral Q4H PRN Ardis Hughsoleman, Carolyn H, NP      ? benztropine (COGENTIN) tablet 1  mg  1 mg Oral Daily Eliseo Gum B, MD   1 mg at 11/29/21 0809  ? diphenhydrAMINE (BENADRYL) capsule 50 mg  50 mg Oral Q8H PRN Eliseo Gum B, MD      ? Or  ? diphenhydrAMINE (BENADRYL) injection 50 mg  50 mg Intramuscular Q6H PRN Eliseo Gum B, MD      ? divalproex (DEPAKOTE) DR tablet 500 mg  500 mg Oral Q12H Vernard Gambles H, NP   500 mg at 11/29/21 1761  ? haloperidol (HALDOL) tablet 10 mg  10 mg Oral QHS Massengill, Harrold Donath, MD   10 mg at 11/28/21 2040  ? haloperidol (HALDOL) tablet 10 mg  10 mg Oral Daily Eliseo Gum B, MD   10 mg at 11/29/21 6073  ? hydrOXYzine (ATARAX) tablet 25 mg  25 mg Oral TID PRN Ardis Hughs, NP   25 mg at  11/24/21 2050  ? LORazepam (ATIVAN) tablet 0.5 mg  0.5 mg Oral BID Massengill, Harrold Donath, MD   0.5 mg at 11/29/21 7106  ? LORazepam (ATIVAN) tablet 1 mg  1 mg Oral Q8H PRN Massengill, Harrold Donath, MD   1 mg at 11/24/21 0044  ? magnesium hydroxide (MILK OF MAGNESIA) suspension 30 mL  30 mL Oral Daily PRN Ardis Hughs, NP      ? OLANZapine zydis (ZYPREXA) disintegrating tablet 5 mg  5 mg Oral Q8H PRN Massengill, Harrold Donath, MD      ? And  ? ziprasidone (GEODON) injection 20 mg  20 mg Intramuscular Q8H PRN Massengill, Harrold Donath, MD      ? propranolol (INDERAL) tablet 20 mg  20 mg Oral BID Comer Locket, MD   20 mg at 11/29/21 2694  ? traZODone (DESYREL) tablet 50 mg  50 mg Oral QHS,MR X 1 Jaclyn Shaggy, PA-C   50 mg at 11/28/21 2040  ? ? ?Lab Results: No results found for this or any previous visit (from the past 48 hour(s)). ? ?Blood Alcohol level:  ?Lab Results  ?Component Value Date  ? ETH <10 08/01/2021  ? ? ?Metabolic Disorder Labs: ?Lab Results  ?Component Value Date  ? HGBA1C 4.8 11/22/2021  ? MPG 91.06 11/22/2021  ? MPG 96.8 08/01/2021  ? ?Lab Results  ?Component Value Date  ? PROLACTIN 8.6 08/01/2021  ? ?Lab Results  ?Component Value Date  ? CHOL 213 (H) 11/18/2021  ? TRIG 24 11/18/2021  ? HDL >135 11/18/2021  ? CHOLHDL NOT CALCULATED 11/18/2021  ? VLDL 5 11/18/2021  ? LDLCALC NOT CALCULATED 11/18/2021  ? LDLCALC 68 08/01/2021  ? ? ?Physical Findings: ?AIMS: Facial and Oral Movements ?Muscles of Facial Expression: None, normal ?Lips and Perioral Area: None, normal ?Jaw: None, normal ?Tongue: None, normal,Extremity Movements ?Upper (arms, wrists, hands, fingers): None, normal ?Lower (legs, knees, ankles, toes): None, normal, Trunk Movements ?Neck, shoulders, hips: None, normal, Overall Severity ?Severity of abnormal movements (highest score from questions above): None, normal ?Incapacitation due to abnormal movements: None, normal ?Patient's awareness of abnormal movements (rate only patient's report): No Awareness,  Dental Status ?Current problems with teeth and/or dentures?: No ?Does patient usually wear dentures?: No  ?CIWA:    ?COWS:    ? ?Musculoskeletal: ?Strength & Muscle Tone: within normal limits ?Gait & Station: normal ?Patient leans: N/A ? ?Psychiatric Specialty Exam: ? ?Presentation  ?General Appearance: Appropriate for Environment; Casual ? ?Eye Contact:Fair ? ?Speech:Clear and Coherent ? ?Speech Volume:Normal ? ?Handedness:Right ? ? ?Mood and Affect  ?Mood:Euthymic ? ?Affect:Appropriate; Congruent ? ? ?Thought Process  ?  Thought Processes:Goal Directed ? ?Descriptions of Associations:Circumstantial ? ?Orientation:Full (Time, Place and Person) ? ?Thought Content:Logical ? ?History of Schizophrenia/Schizoaffective disorder:No ? ?Duration of Psychotic Symptoms:Less than six months ? ?Hallucinations:Hallucinations: None ? ?Ideas of Reference:None ? ?Suicidal Thoughts:Suicidal Thoughts: No ? ?Homicidal Thoughts:Homicidal Thoughts: No ? ? ?Sensorium  ?Memory:Immediate Fair; Recent Fair ? ?Judgment:-- (Improved) ? ?Insight:Fair ? ? ?Executive Functions  ?Concentration:Fair ? ?Attention Span:Fair ? ?Recall:Poor ? ?Fund of Knowledge:Fair ? ?Language:Good ? ? ?Psychomotor Activity  ?Psychomotor Activity:Psychomotor Activity: Normal ? ? ?Assets  ?Assets:Communication Skills; Resilience; Desire for Improvement ? ? ?Sleep  ?Sleep:Sleep: Good ? ? ? ?Physical Exam: ?Physical Exam ?HENT:  ?   Head: Normocephalic and atraumatic.  ?Pulmonary:  ?   Effort: Pulmonary effort is normal.  ?Neurological:  ?   Mental Status: She is alert and oriented to person, place, and time.  ? ?Review of Systems  ?Psychiatric/Behavioral:  Negative for hallucinations and suicidal ideas.   ?Blood pressure 94/69, pulse 63, temperature 98.4 ?F (36.9 ?C), temperature source Oral, resp. rate 18, height 5\' 2"  (1.575 m), weight 56.7 kg, SpO2 100 %. Body mass index is 22.86 kg/m?. ? ? ?Treatment Plan Summary: ?Daily contact with patient to assess and evaluate  symptoms and progress in treatment and Medication management ? ?Patient is taking her medications and her thoughts are becoming more goal oriented. Would like to see another day of this behavior, but today she appea

## 2021-11-29 NOTE — Group Note (Signed)
Date:  11/29/2021 ?Time:  9:30 AM ? ?Group Topic/Focus:  ?Goals Group:   The focus of this group is to help patients establish daily goals to achieve during treatment and discuss how the patient can incorporate goal setting into their daily lives to aide in recovery. ?Orientation:   The focus of this group is to educate the patient on the purpose and policies of crisis stabilization and provide a format to answer questions about their admission.  The group details unit policies and expectations of patients while admitted. ? ? ? ?Participation Level:  Did Not Attend ? ?Participation Quality:   ? ?Affect:   ? ?Cognitive:   ? ?Insight:  ? ?Engagement in Group:   ? ?Modes of Intervention:   ? ?Additional Comments:   ? ?Jalie Eiland Lashawn Elidia Bonenfant ?11/29/2021, 9:30 AM ? ?

## 2021-11-29 NOTE — Progress Notes (Signed)
?   11/29/21 0530  ?Sleep  ?Number of Hours 6.75  ? ? ?

## 2021-11-29 NOTE — B[facility] Counselor (Addendum)
CSW spoke with patient cousin, Unk Lightning, regarding discharge.  She agreed to call back later in the day regarding logistics around picking patient up. She reports that her mother will be the one arranging it and needs to talk with her.  ? ?Addendum: Family called back and agreed to come to pick up patient at 3pm on Saturday.  CSW notified physician.  ? ? ?Ethyle Tiedt, LCSW, LCAS ?Clincal Social Worker  ?[facility] ? ?

## 2021-11-29 NOTE — [facility] Group Notes (Signed)
Pt did not attend 1400 group. ?

## 2021-11-29 NOTE — Group Note (Signed)
Recreation Therapy Group Note ? ? ?Group Topic:Team Building  ?Group Date: 11/29/2021 ?Start Time: 22 ?End Time: 1050 ?Facilitators: Caroll Rancher, LRT,CTRS ?Location: [address] Day[facility] ? ? ?Goal Area(s) Addresses:  ?Patient will effectively work with peer towards shared goal.  ?Patient will identify skills used to make activity successful.  ?Patient will identify how skills used during activity can be applied to reach post d/c goals.  ? ?Group Description:Tallest [employer]. In teams of 5-6, patients were given 11 craft pipe cleaners. Using the materials provided, patients were instructed to compete again the opposing team(s) to build the tallest free-standing structure from floor level. The activity was timed; difficulty increased by Clinical research associate as Production designer, theatre/television/film continued.  Systematically resources were removed with additional directions for example, placing one arm behind their back, working in silence, and shape stipulations. LRT facilitated post-activity discussion reviewing team processes and necessary communication skills involved in completion. Patients were encouraged to reflect how the skills utilized, or not utilized, in this activity can be incorporated to positively impact support systems post discharge. ? ? ?Affect/Mood: Appropriate ?  ?Participation Level: Engaged ?  ?Participation Quality: Independent ?  ?Behavior: Appropriate ?  ?Speech/Thought Process: Focused ?  ?Insight: Good ?  ?Judgement: Good ?  ?Modes of Intervention: STEM Activity ?  ?Patient Response to Interventions:  Engaged ?  ?Education Outcome: ? Acknowledges education and In group clarification offered   ? ?Clinical Observations/Individualized Feedback: Pt came in late to group after peers were half way through the activity.  Pt started off observing but then decided to try herself.  Pt got assistance with tower when nursing student entered group.  Pt expressed it was hard not being able to talk but work pretty well using on  hand.  Pt also stated when Plan A doesn't work, try Plan B and if that doesn't work, use Plan C.  Pt went on to ask about family members in the support system not considering her feelings.  Pt was reassured her feelings matter and if she doesn't feel supported by those family members, then she doesn't need to confide in them.  Pt seemed to accept the answer and smiled.  Pt was appropriate and bright throughout group. ? ? ?Plan: Continue to engage patient in RT group sessions 2-3x/week. ? ? ?Caroll Rancher, LRT,CTRS ?11/29/2021 11:25 AM ?

## 2021-11-29 NOTE — Progress Notes (Signed)
Pt presents with pleasant mood , affect congruent. Kaitlyn Cline states she is doing well. She reports sleeping well last night and denies any other acute concerns. Patient states '' I'm doing fine. I am really just ready to go home, they said Friday. '' Patient smiles and brightens when speaking about her birthday and states that she feels ready to discharge. Patient denies any SI/HI or A/V Hallucinations. Patient denies any acute concerns. Pt is observed visible on the unit, attending groups. Pt has been compliant with medications. Pt is safe. Will continue to monitor.  ?

## 2021-11-29 NOTE — Progress Notes (Signed)
?   11/29/21 2000  ?Psych Admission Type (Psych Patients Only)  ?Admission Status Involuntary  ?Psychosocial Assessment  ?Patient Complaints Anxiety;Suspiciousness  ?Eye Contact Brief  ?Facial Expression Anxious;Sad  ?Affect Preoccupied  ?Speech Tangential  ?Interaction Avoidant;Cautious  ?Motor Activity Slow  ?Appearance/Hygiene Disheveled  ?Behavior Characteristics Cooperative  ?Mood Preoccupied;Suspicious  ?Aggressive Behavior  ?Effect No apparent injury  ?Thought Process  ?Coherency Tangential;Blocking  ?Content Preoccupation  ?Delusions Paranoid  ?Perception WDL  ?Hallucination None reported or observed  ?Judgment Poor  ?Confusion WDL  ?Danger to Self  ?Current suicidal ideation? Denies  ?Danger to Others  ?Danger to Others None reported or observed  ? ? ?

## 2021-11-30 ENCOUNTER — Encounter (HOSPITAL_COMMUNITY): Payer: Self-pay

## 2021-11-30 MED ORDER — PROPRANOLOL HCL 10 MG PO TABS
10.0000 mg | ORAL_TABLET | Freq: Two times a day (BID) | ORAL | Status: DC
  Filled 2021-11-30 (×8): qty 1

## 2021-11-30 NOTE — [facility] Group Notes (Signed)
Spirituality group facilitated by Kathrynn Humble, [place].  ? ?Group Description: Group focused on topic of hope. Patients participated in facilitated discussion around topic, connecting with one another around experiences and definitions for hope. Group members engaged with visual explorer photos, reflecting on what hope looks like for them today. Group engaged in discussion around how their definitions of hope are present today in hospital.  ? ?Modalities: Psycho-social ed, Adlerian, Narrative, MI  ? ?Patient Progress: Met with patient after group.  She shared some of her poetry and about her plans to move back home with her family. ? ?[employer], Bcc ?Pager, [phone] ? ?

## 2021-11-30 NOTE — Progress Notes (Signed)
Pt stated she has been sleeping much of the day, pt refused the Trazodone this evening, pt said she would come up and take it if she needed it ? ? ?

## 2021-11-30 NOTE — Progress Notes (Signed)
Adult Psychoeducational Group Note ? ?Date:  11/30/2021 ?Time:  8:39 PM ? ?Group Topic/Focus:  ?Wrap-Up Group:   The focus of this group is to help patients review their daily goal of treatment and discuss progress on daily workbooks. ? ?Participation Level:  Did Not Attend ? ?Participation Quality:  Did not attend ? ?Affect:  Did not attend ? ?Cognitive:  Did not attend ? ?Insight: None ? ?Engagement in Group:  Did not attend ? ?Modes of Intervention:  Did not attend ? ?Additional Comments:   ?Pt was encouraged to attend group but refused  ? ?Gerhard Perches ?11/30/2021, 8:39 PM ?

## 2021-11-30 NOTE — Progress Notes (Signed)
?   11/30/21 2015  ?Psych Admission Type (Psych Patients Only)  ?Admission Status Involuntary  ?Psychosocial Assessment  ?Patient Complaints Suspiciousness  ?Eye Contact Brief  ?Facial Expression Anxious;Sad  ?Affect Preoccupied  ?Speech Tangential  ?Interaction Avoidant;Cautious  ?Motor Activity Slow  ?Appearance/Hygiene Disheveled  ?Behavior Characteristics Cooperative  ?Mood Preoccupied;Suspicious  ?Aggressive Behavior  ?Effect No apparent injury  ?Thought Process  ?Coherency Tangential;Blocking  ?Content Preoccupation  ?Delusions Paranoid  ?Perception WDL  ?Hallucination None reported or observed  ?Judgment Poor  ?Confusion WDL  ?Danger to Self  ?Current suicidal ideation? Denies  ?Danger to Others  ?Danger to Others None reported or observed  ? ? ?

## 2021-11-30 NOTE — Progress Notes (Signed)
Pt denies SI/HI/AVH and verbally agrees to approach staff if these become apparent or before harming themselves/others. Rates depression 0/10. Rates anxiety 0/10. Rates pain 0/10. Pt stated that she has had blurry vision when she tries to read. MD was notified. Pt has been in her room for the majority of the day. Pt seems to have a look of being lethargic. Pt did state that she has been tired all day. Pt has a stable ambulation. BP has been running low, but not too low. Gatorade was given and BP meds were not given. Scheduled medications administered to pt, per MD orders. RN provided support and encouragement to pt. Q15 min safety checks implemented and continued. Pt safe on the unit. RN will continue to monitor and intervene as needed.  ? 11/30/21 0806  ?Psych Admission Type (Psych Patients Only)  ?Admission Status Involuntary  ?Psychosocial Assessment  ?Patient Complaints None;Suspiciousness  ?Eye Contact Brief  ?Facial Expression Blank;Flat;Sad  ?Affect Preoccupied  ?Speech Soft  ?Interaction Cautious;Minimal;Isolative  ?Motor Activity Slow  ?Appearance/Hygiene Improved  ?Behavior Characteristics Cooperative  ?Mood Suspicious;Preoccupied  ?Thought Process  ?Coherency Blocking  ?Content Preoccupation  ?Delusions Paranoid  ?Perception WDL  ?Hallucination None reported or observed  ?Judgment Poor  ?Confusion None  ?Danger to Self  ?Current suicidal ideation? Denies  ?Danger to Others  ?Danger to Others None reported or observed  ? ? ?

## 2021-11-30 NOTE — Group Note (Signed)
LCSW Group Therapy Note ? ?Group Date: 11/30/2021 ?Start Time: 1100 ?End Time: 1200 ? ? ?Type of Therapy and Topic:  Group Therapy: Anger Cues and Responses ? ?Participation Level:  Active ? ? ?Description of Group:   ?In this group, patients learned how to recognize the physical, cognitive, emotional, and behavioral responses they have to anger-provoking situations.  They identified a recent time they became angry and how they reacted.  They analyzed how their reaction was possibly beneficial and how it was possibly unhelpful.  The group discussed a variety of healthier coping skills that could help with such a situation in the future.  Focus was placed on how helpful it is to recognize the underlying emotions to our anger, because working on those can lead to a more permanent solution as well as our ability to focus on the important rather than the urgent. ? ?Therapeutic Goals: ?Patients will remember their last incident of anger and how they felt emotionally and physically, what their thoughts were at the time, and how they behaved. ?Patients will identify how their behavior at that time worked for them, as well as how it worked against them. ?Patients will explore possible new behaviors to use in future anger situations. ?Patients will learn that anger itself is normal and cannot be eliminated, and that healthier reactions can assist with resolving conflict rather than worsening situations. ? ?Summary of Patient Progress:  Due to lack of staffing patient was provided with written information and worksheets about Anger Management.  CSW was able to review information and answer questions patient had around material provided.  ? ?Therapeutic Modalities:   ?Cognitive Behavioral Therapy ? ? ? ?Otelia Santee, LCSW ?11/30/2021  11:04 AM   ? ?

## 2021-11-30 NOTE — Progress Notes (Signed)
[facility] MD Progress Note ? ?11/30/2021 10:21 AM ?Kaitlyn Cline  ?MRN:  [id number] ?Subjective:   ?Kaitlyn Cline is a 21 yo patient w/ PPH of Bipolar disorder type 1 and PMH of migraines who presented to [facility] under IVC endorsing bizarre behavior, decreased sleep, and euphoric mood. Patient IVC continued at [facility]. ? ?On assessment this AM patient reports that she is sleeping and eating well. Patient reports that she is not afraid to look in the mirror and has not been for days. Patient reports she has been talking to family, and they are asking to make sure that her records of her hospitalization are accessible for better follow-up. Patient endorses that she is less anxious about things. Patient reports she does not really have any worries. Patient denies SI, HI and AVH. Patient endorses that she is looking forward to group. Patient does not endorse that she is having adverse side effects to medications.  ? ?No behavior concerns per nursing and patient is compliant with medication.  ? ?Principal Problem: Bipolar I disorder, current or most recent episode manic, with psychotic features (HCC) ?Diagnosis: Principal Problem: ?  Bipolar I disorder, current or most recent episode manic, with psychotic features (HCC) ? ?Total Time spent with patient: 15 minutes ? ?Past Psychiatric History: See H&P ? ?Past Medical History: History reviewed. No pertinent past medical history. History reviewed. No pertinent surgical history. ?Family History: History reviewed. No pertinent family history. ?Family Psychiatric  History:  See H&P ?Social History:  ?Social History  ? ?Substance and Sexual Activity  ?Alcohol Use Yes  ?   ?Social History  ? ?Substance and Sexual Activity  ?Drug Use Yes  ? Types: Marijuana  ?  ?Social History  ? ?Socioeconomic History  ? Marital status: Single  ?  Spouse name: Not on file  ? Number of children: Not on file  ? Years of education: 42  ? Highest education level: Some college, no degree  ?Occupational History  ? Not  on file  ?Tobacco Use  ? Smoking status: Never  ? Smokeless tobacco: Not on file  ?Vaping Use  ? Vaping Use: Unknown  ?Substance and Sexual Activity  ? Alcohol use: Yes  ? Drug use: Yes  ?  Types: Marijuana  ? Sexual activity: Not Currently  ?Other Topics Concern  ? Not on file  ?Social History Narrative  ? Not on file  ? ?Social Determinants of Health  ? ?Financial Resource Strain: Not on file  ?Food Insecurity: Not on file  ?Transportation Needs: Not on file  ?Physical Activity: Not on file  ?Stress: Not on file  ?Social Connections: Not on file  ? ?Additional Social History:  ?  ?  ?  ?  ?  ?  ?  ?  ?  ?  ?  ? ?Sleep: Good ? ?Appetite:  Good ? ?Current Medications: ?Current Facility-Administered Medications  ?Medication Dose Route Frequency Provider Last Rate Last Admin  ? acetaminophen (TYLENOL) tablet 650 mg  650 mg Oral Q6H PRN Ardis Hughs, NP   325 mg at 11/24/21 1108  ? alum & mag hydroxide-simeth (MAALOX/MYLANTA) 200-200-20 MG/5ML suspension 30 mL  30 mL Oral Q4H PRN Ardis Hughs, NP      ? benztropine (COGENTIN) tablet 1 mg  1 mg Oral Daily Eliseo Gum B, MD   1 mg at 11/30/21 0806  ? diphenhydrAMINE (BENADRYL) capsule 50 mg  50 mg Oral Q8H PRN Bobbye Morton, MD      ? Or  ?  diphenhydrAMINE (BENADRYL) injection 50 mg  50 mg Intramuscular Q6H PRN Eliseo Gum B, MD      ? divalproex (DEPAKOTE) DR tablet 500 mg  500 mg Oral Q12H Vernard Gambles H, NP   500 mg at 11/30/21 4970  ? haloperidol (HALDOL) tablet 10 mg  10 mg Oral QHS Massengill, Harrold Donath, MD   10 mg at 11/29/21 2107  ? haloperidol (HALDOL) tablet 10 mg  10 mg Oral Daily Eliseo Gum B, MD   10 mg at 11/30/21 2637  ? hydrOXYzine (ATARAX) tablet 25 mg  25 mg Oral TID PRN Ardis Hughs, NP   25 mg at 11/24/21 2050  ? LORazepam (ATIVAN) tablet 0.5 mg  0.5 mg Oral BID Massengill, Harrold Donath, MD   0.5 mg at 11/30/21 0806  ? LORazepam (ATIVAN) tablet 1 mg  1 mg Oral Q8H PRN Massengill, Harrold Donath, MD   1 mg at 11/24/21 0044  ? magnesium  hydroxide (MILK OF MAGNESIA) suspension 30 mL  30 mL Oral Daily PRN Ardis Hughs, NP      ? OLANZapine zydis (ZYPREXA) disintegrating tablet 5 mg  5 mg Oral Q8H PRN Massengill, Harrold Donath, MD      ? And  ? ziprasidone (GEODON) injection 20 mg  20 mg Intramuscular Q8H PRN Massengill, Harrold Donath, MD      ? propranolol (INDERAL) tablet 10 mg  10 mg Oral BID Eliseo Gum B, MD      ? traZODone (DESYREL) tablet 50 mg  50 mg Oral QHS,MR X 1 Melbourne Abts W, PA-C   50 mg at 11/29/21 2106  ? ? ?Lab Results: No results found for this or any previous visit (from the past 48 hour(s)). ? ?Blood Alcohol level:  ?Lab Results  ?Component Value Date  ? ETH <10 08/01/2021  ? ? ?Metabolic Disorder Labs: ?Lab Results  ?Component Value Date  ? HGBA1C 4.8 11/22/2021  ? MPG 91.06 11/22/2021  ? MPG 96.8 08/01/2021  ? ?Lab Results  ?Component Value Date  ? PROLACTIN 8.6 08/01/2021  ? ?Lab Results  ?Component Value Date  ? CHOL 213 (H) 11/18/2021  ? TRIG 24 11/18/2021  ? HDL >135 11/18/2021  ? CHOLHDL NOT CALCULATED 11/18/2021  ? VLDL 5 11/18/2021  ? LDLCALC NOT CALCULATED 11/18/2021  ? LDLCALC 68 08/01/2021  ? ? ?Physical Findings: ?AIMS: Facial and Oral Movements ?Muscles of Facial Expression: None, normal ?Lips and Perioral Area: None, normal ?Jaw: None, normal ?Tongue: None, normal,Extremity Movements ?Upper (arms, wrists, hands, fingers): None, normal ?Lower (legs, knees, ankles, toes): None, normal, Trunk Movements ?Neck, shoulders, hips: None, normal, Overall Severity ?Severity of abnormal movements (highest score from questions above): None, normal ?Incapacitation due to abnormal movements: None, normal ?Patient's awareness of abnormal movements (rate only patient's report): No Awareness, Dental Status ?Current problems with teeth and/or dentures?: No ?Does patient usually wear dentures?: No  ?CIWA:    ?COWS:    ? ?Musculoskeletal: ?Strength & Muscle Tone: within normal limits ?Gait & Station: normal ?Patient leans: N/A ? ?Psychiatric  Specialty Exam: ? ?Presentation  ?General Appearance: Appropriate for Environment; Casual ? ?Eye Contact:Good ? ?Speech:Clear and Coherent ? ?Speech Volume:Normal ? ?Handedness:Right ? ? ?Mood and Affect  ?Mood:Euthymic ? ?Affect:Appropriate; Congruent ? ? ?Thought Process  ?Thought Processes:Coherent ? ?Descriptions of Associations:Intact ? ?Orientation:Full (Time, Place and Person) ? ?Thought Content:Logical ? ?History of Schizophrenia/Schizoaffective disorder:No ? ?Duration of Psychotic Symptoms:N/A ? ?Hallucinations:Hallucinations: None ? ?Ideas of Reference:None ? ?Suicidal Thoughts:Suicidal Thoughts: No ? ?Homicidal Thoughts:Homicidal Thoughts: No ? ? ?Sensorium  ?  Memory:Immediate Fair; Recent Fair ? ?Judgment:Fair ? ?Insight:Fair ? ? ?Executive Functions  ?Concentration:Fair ? ?Attention Span:Fair ? ?Recall:Poor ? ?Fund of Knowledge:Fair ? ?Language:Fair ? ? ?Psychomotor Activity  ?Psychomotor Activity:Psychomotor Activity: Decreased ? ? ?Assets  ?Assets:Resilience; Desire for Improvement; Communication Skills; Social Support; Housing ? ? ?Sleep  ?Sleep:Sleep: Good ? ? ? ?Physical Exam: ?Physical Exam ?HENT:  ?   Head: Normocephalic and atraumatic.  ?Pulmonary:  ?   Effort: Pulmonary effort is normal.  ?Neurological:  ?   Mental Status: She is alert and oriented to person, place, and time.  ? ?Review of Systems  ?Psychiatric/Behavioral:  Negative for depression, hallucinations and suicidal ideas.   ?Blood pressure (!) 89/63, pulse 99, temperature 98.5 ?F (36.9 ?C), temperature source Oral, resp. rate 18, height  (1.575 m), weight 56.7 kg, SpO2 98 %. Body mass index is 22.86 kg/m?. ? ? ?Treatment Plan Summary: ?Daily contact with patient to assess and evaluate symptoms and progress in treatment and Medication management ? ?Patient judgement and insight are significantly improved. Patient thoughts appear coherent and less paranoid. Patient family coming for patient tom. Patient is asking appropriate  questions to ensure compliance and good follow-up OP. ? ? ? ?Labs Reviewed:CBC- WNL, CMP- WNL, Lipid panel-  Cholesterol 213, Depakote lvl- <10, U preg- neg, UDS: Neg ?TSH and A1c pending ?  ?EKG- QTC 457 wi

## 2021-11-30 NOTE — Group Note (Signed)
Recreation Therapy Group Note ? ? ?Group Topic:Communication  ?Group Date: 11/30/2021 ?Start Time: 1015 ?End Time: 1100 ?Facilitators: Caroll Rancher, LRT,CTRS ?Location: [address] Day[address] ? ? ?Goal Area(s) Addresses:  ?Patient will effectively listen to complete activity.  ?Patient will identify communication skills used to make activity successful.  ?Patient will identify how skills used during activity can be used to reach post d/c goals.  ?  ?Group Description: Geometric Drawings.  Three volunteers from the peer group will be shown an abstract picture with a particular arrangement of geometrical shapes.  Each round, one 'speaker' will describe the pattern, as accurately as possible without revealing the image to the group.  The remaining group members will listen and draw the picture to reflect how it is described to them. Patients with the role of 'listener' cannot ask clarifying questions but, may request that the speaker repeat a direction. Once the drawings are complete, the presenter will show the rest of the group the picture and compare how close each person came to drawing the picture. LRT will facilitate a post-activity discussion regarding effective communication and the importance of planning, listening, and asking for clarification in daily interactions with others. ? ? ?Affect/Mood: Appropriate ?  ?Participation Level: Engaged ?  ?Participation Quality: Independent ?  ?Behavior: Appropriate ?  ?Speech/Thought Process: Focused ?  ?Insight: Good ?  ?Judgement: Good ?  ?Modes of Intervention: Drawing ?  ?Patient Response to Interventions:  Engaged ?  ?Education Outcome: ? Acknowledges education and In group clarification offered   ? ?Clinical Observations/Individualized Feedback: Pt was bright and attentive during group session.  Pt was focused during activity.  Pt was one of the presenters.  Pt did good presenting but could have been a little more detailed in certain areas.  Pt expressed during  discussion people using their hands doesn't bother her.  Pt also expressed she would ask questions or a person to repeat themselves if she didn't understand what they were saying.   ? ? ?Plan: Continue to engage patient in RT group sessions 2-3x/week. ? ? ?Caroll Rancher, LRT,CTRS ?11/30/2021 11:22 AM ?

## 2021-11-30 NOTE — Progress Notes (Signed)
?   11/30/21 0515  ?Sleep  ?Number of Hours 8  ? ? ?

## 2021-11-30 NOTE — BH IP Treatment Plan (Signed)
Interdisciplinary Treatment and Diagnostic Plan Update ? ?11/30/2021 ?Time of Session: 9:05am  ?Kaitlyn Cline ?MRN: [id number] ? ?Principal Diagnosis: Bipolar I disorder, current or most recent episode manic, with psychotic features (HCC) ? ?Secondary Diagnoses: Principal Problem: ?  Bipolar I disorder, current or most recent episode manic, with psychotic features (HCC) ? ? ?Current Medications:  ?Current Facility-Administered Medications  ?Medication Dose Route Frequency Provider Last Rate Last Admin  ? acetaminophen (TYLENOL) tablet 650 mg  650 mg Oral Q6H PRN Ardis Hughsoleman, Carolyn H, NP   325 mg at 11/24/21 1108  ? alum & mag hydroxide-simeth (MAALOX/MYLANTA) 200-200-20 MG/5ML suspension 30 mL  30 mL Oral Q4H PRN Ardis Hughsoleman, Carolyn H, NP      ? benztropine (COGENTIN) tablet 1 mg  1 mg Oral Daily Eliseo GumMcQuilla, Jai B, MD   1 mg at 11/30/21 0806  ? diphenhydrAMINE (BENADRYL) capsule 50 mg  50 mg Oral Q8H PRN Eliseo GumMcQuilla, Jai B, MD      ? Or  ? diphenhydrAMINE (BENADRYL) injection 50 mg  50 mg Intramuscular Q6H PRN Eliseo GumMcQuilla, Jai B, MD      ? divalproex (DEPAKOTE) DR tablet 500 mg  500 mg Oral Q12H Vernard Gamblesoleman, Carolyn H, NP   500 mg at 11/30/21 [id number]  ? haloperidol (HALDOL) tablet 10 mg  10 mg Oral QHS Massengill, Harrold DonathNathan, MD   10 mg at 11/29/21 2107  ? haloperidol (HALDOL) tablet 10 mg  10 mg Oral Daily Eliseo GumMcQuilla, Jai B, MD   10 mg at 11/30/21 [id number]  ? hydrOXYzine (ATARAX) tablet 25 mg  25 mg Oral TID PRN Ardis Hughsoleman, Carolyn H, NP   25 mg at 11/24/21 2050  ? LORazepam (ATIVAN) tablet 0.5 mg  0.5 mg Oral BID Massengill, Harrold DonathNathan, MD   0.5 mg at 11/30/21 0806  ? LORazepam (ATIVAN) tablet 1 mg  1 mg Oral Q8H PRN Massengill, Harrold DonathNathan, MD   1 mg at 11/24/21 0044  ? magnesium hydroxide (MILK OF MAGNESIA) suspension 30 mL  30 mL Oral Daily PRN Ardis Hughsoleman, Carolyn H, NP      ? OLANZapine zydis (ZYPREXA) disintegrating tablet 5 mg  5 mg Oral Q8H PRN Massengill, Harrold DonathNathan, MD      ? And  ? ziprasidone (GEODON) injection 20 mg  20 mg Intramuscular Q8H PRN  Massengill, Harrold DonathNathan, MD      ? propranolol (INDERAL) tablet 20 mg  20 mg Oral BID Bartholomew CrewsSingleton, Amy E, MD   20 mg at 11/29/21 1708  ? traZODone (DESYREL) tablet 50 mg  50 mg Oral QHS,MR X 1 Jaclyn Shaggyaylor, Cody W, PA-C   50 mg at 11/29/21 2106  ? ?PTA Medications: ?Medications Prior to Admission  ?Medication Sig Dispense Refill Last Dose  ? [START ON 12/18/2021] ARIPiprazole Lauroxil ER (ARISTADA) 1064 MG/3.9ML prefilled syringe Inject 1,064 mg into the muscle every 8 (eight) weeks.     ? divalproex (DEPAKOTE) 500 MG DR tablet Take 1 tablet (500 mg total) by mouth every 12 (twelve) hours.     ? melatonin 5 MG TABS Take 5-10 mg by mouth at bedtime.     ? propranolol (INDERAL) 10 MG tablet Take 1 tablet (10 mg total) by mouth 2 (two) times daily.     ? traZODone (DESYREL) 50 MG tablet Take 1 tablet (50 mg total) by mouth at bedtime and may repeat dose one time if needed. 10 tablet 0   ? ? ?Patient Stressors: Financial difficulties   ?Marital or family conflict   ?Occupational concerns   ? ?Patient Strengths: Communication skills  ?  General fund of knowledge  ?Physical Health  ? ?Treatment Modalities: Medication Management, Group therapy, Case management,  ?1 to 1 session with clinician, Psychoeducation, Recreational therapy. ? ? ?Physician Treatment Plan for Primary Diagnosis: Bipolar I disorder, current or most recent episode manic, with psychotic features (HCC) ?Long Term Goal(s): Improvement in symptoms so as ready for discharge  ? ?Short Term Goals: Ability to identify changes in lifestyle to reduce recurrence of condition will improve ?Ability to verbalize feelings will improve ?Ability to disclose and discuss suicidal ideas ?Ability to demonstrate self-control will improve ?Ability to identify and develop effective coping behaviors will improve ?Ability to maintain clinical measurements within normal limits will improve ?Compliance with prescribed medications will improve ? ?Medication Management: Evaluate patient's response,  side effects, and tolerance of medication regimen. ? ?Therapeutic Interventions: 1 to 1 sessions, Unit Group sessions and Medication administration. ? ?Evaluation of Outcomes: Progressing ? ?Physician Treatment Plan for Secondary Diagnosis: Principal Problem: ?  Bipolar I disorder, current or most recent episode manic, with psychotic features (HCC) ? ?Long Term Goal(s): Improvement in symptoms so as ready for discharge  ? ?Short Term Goals: Ability to identify changes in lifestyle to reduce recurrence of condition will improve ?Ability to verbalize feelings will improve ?Ability to disclose and discuss suicidal ideas ?Ability to demonstrate self-control will improve ?Ability to identify and develop effective coping behaviors will improve ?Ability to maintain clinical measurements within normal limits will improve ?Compliance with prescribed medications will improve    ? ?Medication Management: Evaluate patient's response, side effects, and tolerance of medication regimen. ? ?Therapeutic Interventions: 1 to 1 sessions, Unit Group sessions and Medication administration. ? ?Evaluation of Outcomes: Progressing ? ? ?RN Treatment Plan for Primary Diagnosis: Bipolar I disorder, current or most recent episode manic, with psychotic features (HCC) ?Long Term Goal(s): Knowledge of disease and therapeutic regimen to maintain health will improve ? ?Short Term Goals: Ability to remain free from injury will improve, Ability to participate in decision making will improve, Ability to verbalize feelings will improve, Ability to disclose and discuss suicidal ideas, and Ability to identify and develop effective coping behaviors will improve ? ?Medication Management: RN will administer medications as ordered by provider, will assess and evaluate patient's response and provide education to patient for prescribed medication. RN will report any adverse and/or side effects to prescribing provider. ? ?Therapeutic Interventions: 1 on 1  counseling sessions, Psychoeducation, Medication administration, Evaluate responses to treatment, Monitor vital signs and CBGs as ordered, Perform/monitor CIWA, COWS, AIMS and Fall Risk screenings as ordered, Perform wound care treatments as ordered. ? ?Evaluation of Outcomes: Progressing ? ? ?LCSW Treatment Plan for Primary Diagnosis: Bipolar I disorder, current or most recent episode manic, with psychotic features (HCC) ?Long Term Goal(s): Safe transition to appropriate next level of care at discharge, Engage patient in therapeutic group addressing interpersonal concerns. ? ?Short Term Goals: Engage patient in aftercare planning with referrals and resources, Increase social support, Increase emotional regulation, Facilitate acceptance of mental health diagnosis and concerns, Identify triggers associated with mental health/substance abuse issues, and Increase skills for wellness and recovery ? ?Therapeutic Interventions: Assess for all discharge needs, 1 to 1 time with Child psychotherapist, Explore available resources and support systems, Assess for adequacy in community support network, Educate family and significant other(s) on suicide prevention, Complete Psychosocial Assessment, Interpersonal group therapy. ? ?Evaluation of Outcomes: Progressing ? ? ?Progress in Treatment: ?Attending groups: No. ?Participating in groups: No. ?Taking medication as prescribed: Yes. ?Toleration medication: Yes. ?Family/Significant other contact  made: Yes, individual(s) contacted:  Cousin  ?Patient understands diagnosis: No. ?Discussing patient identified problems/goals with staff: Yes. ?Medical problems stabilized or resolved: Yes. ?Denies suicidal/homicidal ideation: Yes. ?Issues/concerns per patient self-inventory: No. ?  ?  ?New problem(s) identified: No, Describe:  None  ?  ?New Short Term/Long Term Goal(s): medication stabilization, elimination of SI thoughts, development of comprehensive mental wellness plan.  ?  ?Patient Goals:  "To better myself and remain stable"  ?  ?Discharge Plan or Barriers: Patient is to move to [place] to live with family. Patient is to follow up with [facility] in [place]. [place].  ?  ?Reason for Continuation of Hospi

## 2021-12-01 MED ORDER — DIVALPROEX SODIUM 500 MG PO DR TAB: 500.0000 mg | DELAYED_RELEASE_TABLET | Freq: Two times a day (BID) | ORAL | 0 refills | Status: AC

## 2021-12-01 MED ORDER — BENZTROPINE MESYLATE 1 MG PO TABS: 1.0000 mg | ORAL_TABLET | Freq: Every day | ORAL | 0 refills | Status: AC

## 2021-12-01 MED ORDER — PROPRANOLOL HCL 10 MG PO TABS: 10.0000 mg | ORAL_TABLET | Freq: Two times a day (BID) | ORAL | 0 refills | Status: AC

## 2021-12-01 MED ORDER — LORAZEPAM 0.5 MG PO TABS: 0.5000 mg | ORAL_TABLET | Freq: Two times a day (BID) | ORAL | 0 refills | Status: AC

## 2021-12-01 MED ORDER — HYDROXYZINE HCL 25 MG PO TABS: 25.0000 mg | ORAL_TABLET | Freq: Three times a day (TID) | ORAL | 0 refills | Status: AC | PRN

## 2021-12-01 MED ORDER — HALOPERIDOL 10 MG PO TABS: 10.0000 mg | ORAL_TABLET | Freq: Two times a day (BID) | ORAL | 0 refills | Status: AC

## 2021-12-01 NOTE — Discharge Summary (Signed)
Physician Discharge Summary Note ? ?Patient:  Kaitlyn Cline is an 21 y.o., female ?MRN:  [id number] ?DOB:  06/27/2001 ?Patient phone:  [phone] (home)  ?Patient address:   ?[address] ?[place] [id number],  ?Total Time spent with patient: 20 minutes ? ?Date of Admission:  11/20/2021 ?Date of Discharge: 12/01/2021 ? ?Reason for Admission:  Patient reports that she has been in a psych hospital every month since 06/2021 and even shows 2 of her hospital bills. Patient reports that she is not really sure why she is in the hospital, but recalls that she was "singing, couldn't sit down, and hadn't slept in 5-6 days." Patient reports that her family is in the process of moving her to [place] where she is originally from. Patient reports that she is prescribed Depakote and also received Aristada. Patient reports that her understanding at her last discharge in 09/2020 was that she did not need to take medicine because she go the [facility] and would not need again until 12/18/2021.  ?  ?Patient reports that she has been having episodes of decreased sleep and bizarre behavior and these have led to her hospitalizations. Patient reports that she has loss her job at [employer] after threatening someone during one of these episodes with decreased sleep. Patient reports that when she does not sleep she may be a bit paranoid. Patient endorses some paranoia today and asks that her family be called, but asks on multiple occasions that they only receive "certain" information. Patient is not able to clarify what info is not ok nor why she is afraid that they be contacted. Patient does give consent to talk to family. Patient reports that in the past 24 hrs she has had AVH. Patient reports that this AM she had to cover her mirror and close her blinds because " I'm seeing things I shouldn't and its scaring me." Patient reports she also believed she heard a dog, this AM in the hospital, despite there not being one. Patient reports that she does not  believe she is receiving special messages just for her nor does she believe that she has special powers. Patient reports that she does believe in thought insertion and is concerned about this being possible. Patient denies SI and HI. Patient reports she has had SI in the past but no SA.  ? ?Principal Problem: Bipolar I disorder, current or most recent episode manic, with psychotic features (HCC) ?Discharge Diagnoses: Principal Problem: ?  Bipolar I disorder, current or most recent episode manic, with psychotic features (HCC) ? ? ?Past Psychiatric History: Bipolar disorder and multiple hospitalizations (latest [facility] 09/2021). ? ?Past Medical History: History reviewed. No pertinent past medical history. History reviewed. No pertinent surgical history. ?Family History: History reviewed. No pertinent family history. ?Family Psychiatric  History: Mat great- aunt: Is on Trazodone unknown official dx "she is like me" ?Mom- Cocaine use ?Denies known suicides in the family ?Social History:  ?Social History  ? ?Substance and Sexual Activity  ?Alcohol Use Yes  ?   ?Social History  ? ?Substance and Sexual Activity  ?Drug Use Yes  ? Types: Marijuana  ?  ?Social History  ? ?Socioeconomic History  ? Marital status: Single  ?  Spouse name: Not on file  ? Number of children: Not on file  ? Years of education: 2614  ? Highest education level: Some college, no degree  ?Occupational History  ? Not on file  ?Tobacco Use  ? Smoking status: Never  ? Smokeless tobacco: Not on file  ?  Vaping Use  ? Vaping Use: Unknown  ?Substance and Sexual Activity  ? Alcohol use: Yes  ? Drug use: Yes  ?  Types: Marijuana  ? Sexual activity: Not Currently  ?Other Topics Concern  ? Not on file  ?Social History Narrative  ? Not on file  ? ?Social Determinants of Health  ? ?Financial Resource Strain: Not on file  ?Food Insecurity: Not on file  ?Transportation Needs: Not on file  ?Physical Activity: Not on file  ?Stress: Not on file  ?Social Connections: Not on file   ? ? ?Hospital Course:  ?During the patient's hospitalization, patient had extensive initial psychiatric evaluation, and follow-up psychiatric evaluations every day. ? ?Psychiatric diagnoses provided upon initial assessment: Bipolar 1 disorder, current episode manic severe with psychotic features ? ?Patient's psychiatric medications were adjusted on admission: She was started on Haldol, Cogentin, and Depakote.  She was continued on her Inderal. ? ?During the hospitalization, other adjustments were made to the patient's psychiatric medication regimen: Her medications were titrated during her stay but not changed. ? ?Patient's care was discussed during the interdisciplinary team meeting every day during the hospitalization. ? ?The patient reports not having side effects to prescribed psychiatric medication. ? ?Gradually, patient started adjusting to milieu. The patient was evaluated each day by a clinical provider to ascertain response to treatment. Improvement was noted by the patient's report of decreasing symptoms, improved sleep and appetite, affect, medication tolerance, behavior, and participation in unit programming.  Patient was asked each day to complete a self inventory noting mood, mental status, pain, new symptoms, anxiety and concerns.   ?Symptoms were reported as significantly decreased or resolved completely by discharge.  ?The patient reports that their mood is stable.  ?The patient denied having suicidal thoughts for more than 48 hours prior to discharge.  Patient denies having homicidal thoughts.  Patient denies having auditory hallucinations.  Patient denies any visual hallucinations or other symptoms of psychosis.  ?The patient was motivated to continue taking medication with a goal of continued improvement in mental health.  ? ?The patient reports their target psychiatric symptoms of paranoia and disorganized thinking responded well to the psychiatric medications, and the patient reports overall  benefit other psychiatric hospitalization. Supportive psychotherapy was provided to the patient. The patient also participated in regular group therapy while hospitalized. Coping skills, problem solving as well as relaxation therapies were also part of the unit programming. ? ?Labs were reviewed with the patient, and abnormal results were discussed with the patient. ? ?The patient is able to verbalize their individual safety plan to this provider. ? ?# It is recommended to the patient to continue psychiatric medications as prescribed, after discharge from the hospital.   ? ?# It is recommended to the patient to follow up with your outpatient psychiatric provider and PCP. ? ?# It was discussed with the patient, the impact of alcohol, drugs, tobacco have been there overall psychiatric and medical wellbeing, and total abstinence from substance use was recommended the patient.ed. ? ?# Prescriptions provided or sent directly to preferred pharmacy at discharge. Patient agreeable to plan. Given opportunity to ask questions. Appears to feel comfortable with discharge.  ?  ?# In the event of worsening symptoms, the patient is instructed to call the crisis hotline, 911 and or go to the nearest ED for appropriate evaluation and treatment of symptoms. To follow-up with primary care provider for other medical issues, concerns and or health care needs ? ?# Patient was discharged home with her  parents with a plan to follow up as noted below. ? ? ? ?On day of discharge she reports she is doing good today.  She reports she slept well last night.  She reports her appetite is doing good.  She reports no SI, HI, or AVH.  She reports no P[place]oia, Ideas of Reference, or other First Rank symptoms.  She reports no issues with her medications.  Discussed with her the importance of medication compliance and making her follow up appointments.  Discussed the importance of birth control while being on Depakote.  Discussed that it can cause  significant birth defects or death and that she needs to discuss options with her PCP which she has an appointment scheduled for.  She reported understanding and had no concerns.  Discussed with her what to

## 2021-12-01 NOTE — [facility] Suicide Risk Assessment (Signed)
Suicide Risk Assessment ? ?Discharge Assessment    ?[facility] Discharge Suicide Risk Assessment ? ? ?Principal Problem: Bipolar I disorder, current or most recent episode manic, with psychotic features ([place]) ?Discharge Diagnoses: Principal Problem: ?  Bipolar I disorder, current or most recent episode manic, with psychotic features ([place]) ? ?During the patient's hospitalization, patient had extensive initial psychiatric evaluation, and follow-up psychiatric evaluations every day. ? ?Psychiatric diagnoses provided upon initial assessment: Bipolar 1 disorder, current episode manic severe with psychotic features ? ?Patient's psychiatric medications were adjusted on admission: She was started on Haldol, Cogentin, and Depakote.  She was continued on her Inderal. ? ?During the hospitalization, other adjustments were made to the patient's psychiatric medication regimen: Her medications were titrated during her stay but not changed. ? ?Gradually, patient started adjusting to milieu.   ?Patient's care was discussed during the interdisciplinary team meeting every day during the hospitalization. ? ?The patient reports not having side effects to prescribed psychiatric medication. ? ?The patient reports their target psychiatric symptoms of paranoia and disorganized thinking responded well to the psychiatric medications, and the patient reports overall benefit other psychiatric hospitalization. Supportive psychotherapy was provided to the patient. The patient also participated in regular group therapy while admitted.  ? ?Labs were reviewed with the patient, and abnormal results were discussed with the patient. ? ?The patient denied having suicidal thoughts more than 48 hours prior to discharge.  Patient denies having homicidal thoughts.  Patient denies having auditory hallucinations.  Patient denies any visual hallucinations.  Patient denies having paranoid thoughts. ? ?The patient is able to verbalize their individual safety plan to  this provider. ? ?It is recommended to the patient to continue psychiatric medications as prescribed, after discharge from the hospital.   ? ?It is recommended to the patient to follow up with your outpatient psychiatric provider and PCP. ? ?Discussed with the patient, the impact of alcohol, drugs, tobacco have been there overall psychiatric and medical wellbeing, and total abstinence from substance use was recommended the patient. ? ?Total Time spent with patient: 20 minutes ? ?Musculoskeletal: ?Strength & Muscle Tone: within normal limits ?Gait & Station: normal ?Patient leans: N/A ? ?Psychiatric Specialty Exam ? ?Presentation  ?General Appearance: Appropriate for Environment; Casual ? ?Eye Contact:Good ? ?Speech:Clear and Coherent ? ?Speech Volume:Normal ? ?Handedness:Right ? ? ?Mood and Affect  ?Mood:Euthymic ? ?Duration of Depression Symptoms: Greater than two weeks ? ?Affect:Appropriate; Congruent ? ? ?Thought Process  ?Thought Processes:Coherent ? ?Descriptions of Associations:Intact ? ?Orientation:Full (Time, Place and Person) ? ?Thought Content:Logical ?No SI, HI, or AVH. No Paranoia, Ideas of Reference, or First Rank symptoms. ? ?History of Schizophrenia/Schizoaffective disorder:No ? ?Duration of Psychotic Symptoms:N/A ? ?Hallucinations:Hallucinations: None ? ?Ideas of Reference:None ? ?Suicidal Thoughts:Suicidal Thoughts: No ? ?Homicidal Thoughts:Homicidal Thoughts: No ? ? ?Sensorium  ?Memory:Immediate Fair; Recent Fair ? ?Judgment:Fair ? ?Insight:Fair ? ? ?Executive Functions  ?Concentration:Fair ? ?Attention Span:Fair ? ?Recall:Poor ? ?[place] ? ?Language:Fair ? ? ?Psychomotor Activity  ?Psychomotor Activity:Psychomotor Activity: Decreased ? ? ?Assets  ?Assets:Resilience; Desire for Improvement; Communication Skills; Social Support; Housing ? ? ?Sleep  ?Sleep:Sleep: Good ? ? ?AIMS ? 12/01/21 1103  ?Facial and Oral Movements  ?Muscles of Facial Expression 0  ?Lips and Perioral Area 0  ?Jaw  0  ?Tongue 0  ?Extremity Movements  ?Upper (arms, wrists, hands, fingers) 0  ?Lower (legs, knees, ankles, toes) 0  ?Trunk Movements  ?Neck, shoulders, hips 0  ?Overall Severity  ?Severity of abnormal movements (highest score from questions above)  0  ?Incapacitation due to abnormal movements 0  ?Patient's awareness of abnormal movements (rate only patient's report) 0  ?Dental Status  ?Current problems with teeth and/or dentures? No  ?Does patient usually wear dentures? No  ?AIMS Total Score  ?AIMS Total Score 0  ?No Cogwheeling or Rigidity present. ? ? ?Physical Exam: ?Physical Exam ?Constitutional:   ?   General: She is not in acute distress. ?   Appearance: Normal appearance. She is normal weight. She is not ill-appearing or toxic-appearing.  ?HENT:  ?   Head: Normocephalic and atraumatic.  ?Pulmonary:  ?   Effort: Pulmonary effort is normal.  ?Musculoskeletal:     ?   General: Normal range of motion.  ?Neurological:  ?   General: No focal deficit present.  ?   Mental Status: She is alert.  ? ?Review of Systems  ?Respiratory:  Negative for cough and shortness of breath.   ?Cardiovascular:  Negative for chest pain.  ?Gastrointestinal:  Negative for abdominal pain, constipation, diarrhea, nausea and vomiting.  ?Neurological:  Negative for dizziness, weakness and headaches.  ?Psychiatric/Behavioral:  Negative for depression, hallucinations and suicidal ideas. The patient is not nervous/anxious.   ?Blood pressure 93/68, pulse (!) 114, temperature 98.5 ?F (36.9 ?C), temperature source Oral, resp. rate 16, height 5\' 2"  (1.575 m), weight 56.7 kg, SpO2 100 %. Body mass index is 22.86 kg/m?. ? ?Mental Status Per Nursing Assessment::   ?On Admission:  NA ? ?Demographic Factors:  ?Adolescent or young adult and Unemployed ? ?Loss Factors: ?Decrease in vocational status and leave of absence from school ? ?Historical Factors: ?Impulsivity and Previous psychiatric diagnoses/treatments ? ?Risk Reduction Factors:   ?Living with  another person, especially a relative, Positive social support, and Positive therapeutic relationship ? ?Continued Clinical Symptoms:  ?None ? ?Cognitive Features That Contribute To Risk:  ?None   ? ?Suicide Risk:  ?Minimal: No identifiable suicidal ideation.  Patients presenting with no risk factors but with morbid ruminations; may be classified as minimal risk based on the severity of the depressive symptoms ? ? Follow-up Information   ? ? Felicie Morn, MD Follow up on 12/20/2021.   ?Why: You have an appointment on 12/20/21 at 8:45 am for primary care services. ?Contact information: ?[address] [place], [place] [address] ? ?  ?  ? ? [facility]. Go on 12/06/2021.   ?Why: You have an appointment for therapy services on 12/06/21 at 4:45 pm, in person (near the [place]).  You also have an appointment for medication management services on 01/09/22 at 8:00 am (please go the Virtual waiting room 15 minutes prior)   * An Email has been sent to you with information and forms for you to fill out and submit to the provider as soon as possible. ?Contact information: ?[address] ?[place], [place] [address] ? ?Phone: [phone] ?Fax: [phone] ? ?  ?  ? ?  ?  ? ?  ? ? ?Plan Of Care/Follow-up recommendations:  ?Activity: as tolerated ? ?Diet: heart healthy ? ?Other: ?-Follow-up with your outpatient psychiatric provider -instructions on appointment date, time, and address (location) are provided to you in discharge paperwork. ? ?-Take your psychiatric medications as prescribed at discharge - instructions are provided to you in the discharge paperwork ? ?-Follow-up with outpatient primary care doctor and other specialists -for management of chronic medical disease, including: Migraines and to discuss birth control. ? ?-Testing: Follow-up with outpatient provider for abnormal lab results: None ? ?-Recommend abstinence from  alcohol, tobacco, and other illicit drug use at discharge.  ? ?-If your psychiatric  symptoms recur, worsen, or if you have side effects to your psychiatric medications, call your outpatient psychiatric provider, 911, [phone] or go to the nearest emergency department. ? ?-If suicidal thoughts recu

## 2021-12-01 NOTE — Progress Notes (Signed)
?  [facility] Adult Case Management Discharge Plan : ? ?Will you be returning to the same living situation after discharge:  No. back to [place] to be closer to family. ?At discharge, do you have transportation home?: Yes,  family coming to pick up pt. ?Do you have the ability to pay for your medications: Yes,  has active coverage. ? ?Release of information consent forms completed and in the chart;  Patient's signature needed at discharge. ? ?Patient to Follow up at: ? Follow-up Information   ? ? [facility] Follow up on 12/20/2021.   ?Why: You have an appointment with Felicie Morn, MD on 12/20/21 at 8:45 am for primary care services. ?Contact information: ?[address] ?[place], [place] [address] ?Tel:  [phone] ? ?  ?  ? ? [facility]. Go on 12/06/2021.   ?Why: You have an appointment for therapy services on 12/06/21 at 4:45 pm, in person (near the [facility]).  You also have an appointment for medication management services on 01/09/22 at 8:00 am (please go the Virtual waiting room 15 minutes prior)   * An Email has been sent to you with information and forms for you to fill out and submit to the provider as soon as possible. ?Contact information: ?[address] ?[place], [place] [address] ? ?Phone: [phone] ?Fax: [phone] ? ?  ?  ? ?  ?  ? ?  ? ? ?Next level of care provider has access to [facility] Link:no ? ?Safety Planning and Suicide Prevention discussed: Yes,  with Layla (cousin) per weekday staff. ? ?  ? ?Has patient been referred to the Quitline?: Patient refused referral ? ?Patient has been referred for addiction treatment: N/A ? ?Aldine Contes, LCSWA ?12/01/2021, 9:52 AM ?

## 2021-12-01 NOTE — [facility] Group Notes (Signed)
.  Psychoeducational Group Note ? ? ? ?Date:  4/8//23 ?Time: 1100-1200 ? ? ? ?Purpose of Group: . The group focus' on teaching patients on how to identify their needs and their Life Skills:  A group where two lists are made. What people need and what are things that we do that are unhealthy. The lists are developed by the patients and it is explained that we often do the actions that are not healthy to get our list of needs met. ? ?Goal:: to develop the coping skills needed to get their needs met ? ?Group not done due to acuity of the unit. ? ?Aniyla Harling A ? ?

## 2021-12-01 NOTE — Progress Notes (Signed)
?   12/01/21 0515  ?Sleep  ?Number of Hours 9  ? ? ?

## 2021-12-01 NOTE — Group Note (Signed)
Social Work Group Therapy ?11/28/2021 ?3:00pm-4:00pm ? ?  ?Topic:  Mental Health Myths and Facts ?  ?Description of Group:   Due to staff recommendation and patient confinement to the hall and subsequent inability to enter the dayroom, psychotherapy group was not held.  Patient was provided with written information about mental health myths and facts.  Patient did not ask questions about the handoff. ? ?Chayanne Filippi Grossman-Orr, MSW, LCSW ?11/28/2021 4:00pm ?

## 2021-12-01 NOTE — Progress Notes (Signed)

## 2021-12-01 NOTE — Progress Notes (Signed)
All possessions/property returned to patient and signature obtained. Verbal and written education provided to patient regarding follow up care, treatment plan and discharge medications. Pt verbalized comprehension. Pt currently denies suicidal ideations, denies homicidal ideations, denies visual hallucination, and denies auditory hallucinations. No c/o pain and/or discomfort. All precautions discontinued by physician. Pt remains calm, cooperative, medication-compliant, ambulatory and functioning at Pt's optimal level. There are no indications that this Pt is engaged in self-directed violent behavior, either preparatory or potentially harmful.  ?

## 2023-04-23 IMAGING — DX DG HIP (WITH OR WITHOUT PELVIS) 2-3V*R*
2 series · 3 of 3 positions shown · non-contrast
Comparison: None.

CLINICAL DATA: Right-sided hip pain and cellulitis, initial
encounter

EXAM:
DG HIP (WITH OR WITHOUT PELVIS) 3V RIGHT

[pelvis]
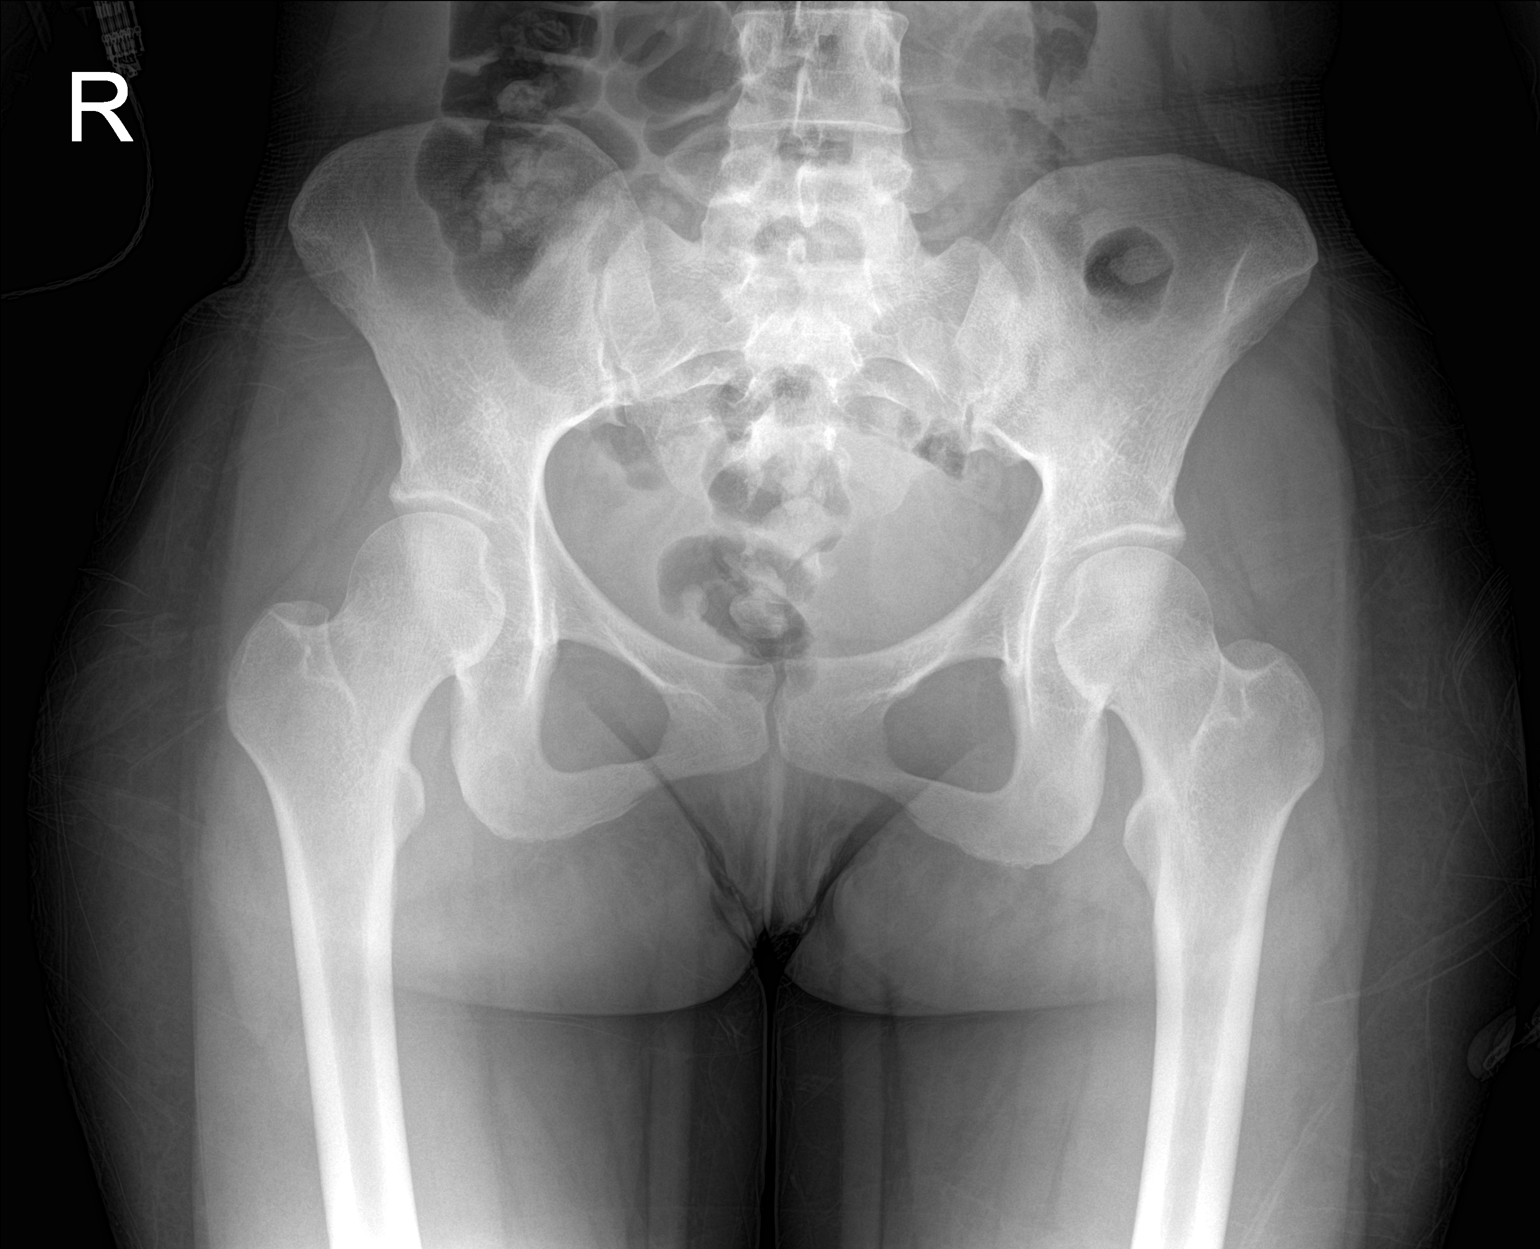

[Series 2: hip · 0.14mm/px · 2 of 2 slices shown]
[im 1/2]
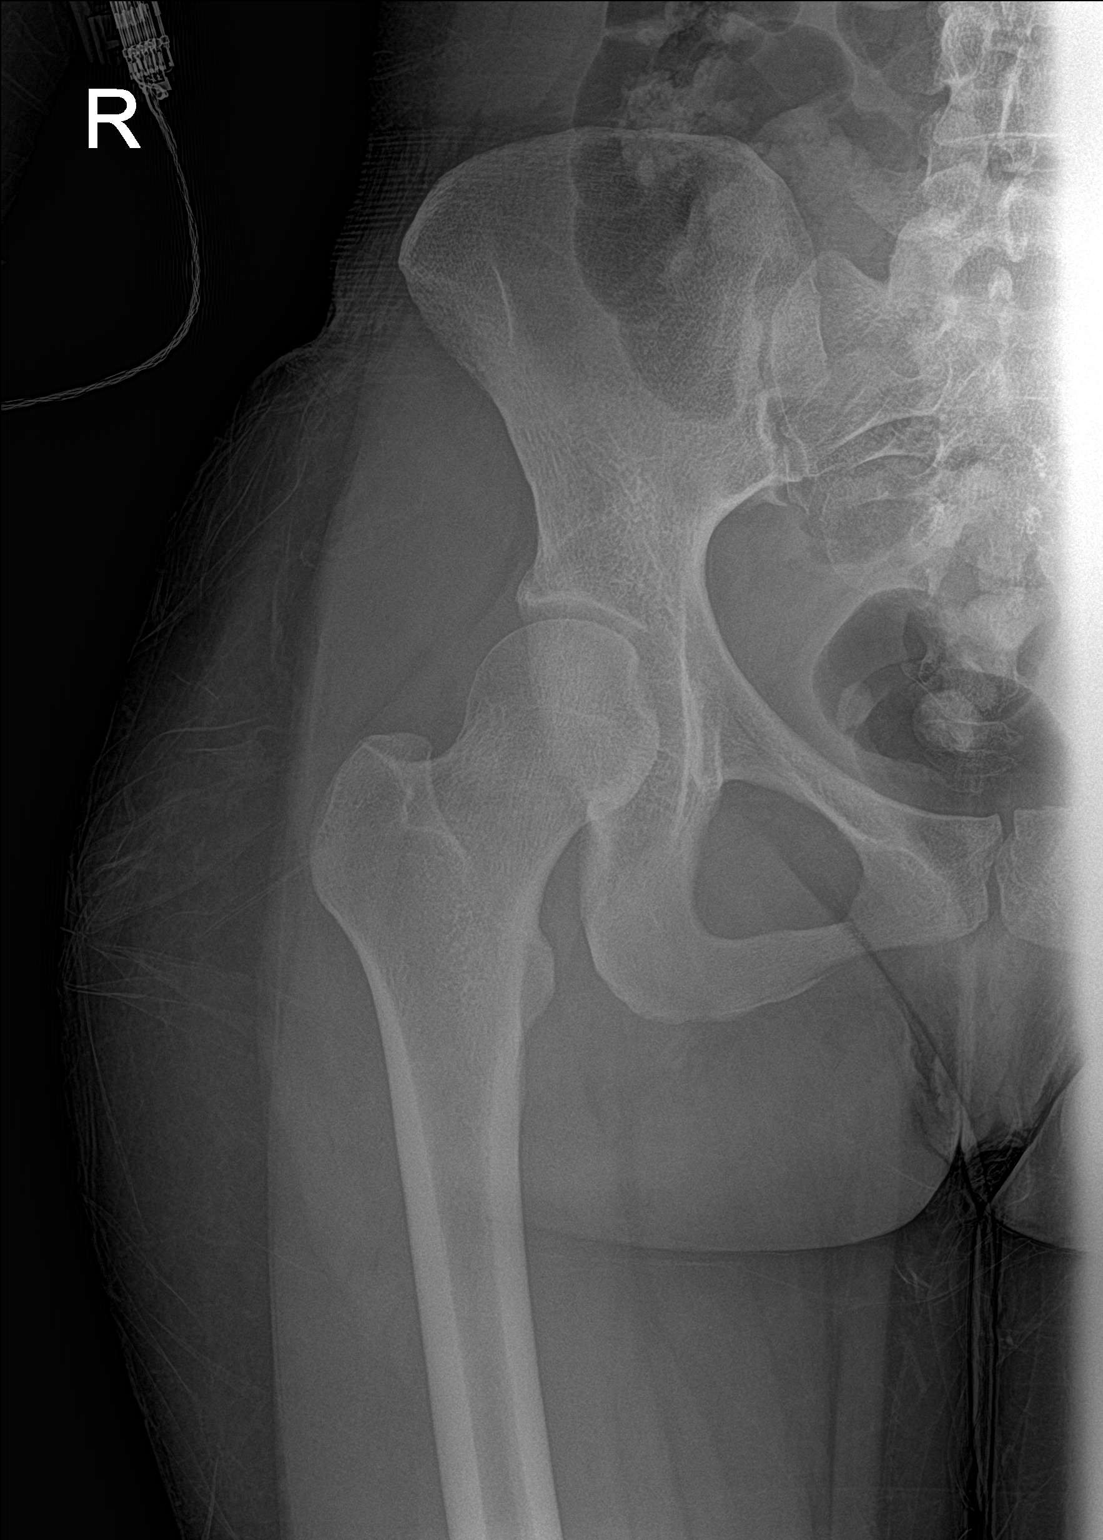
[im 2/2]
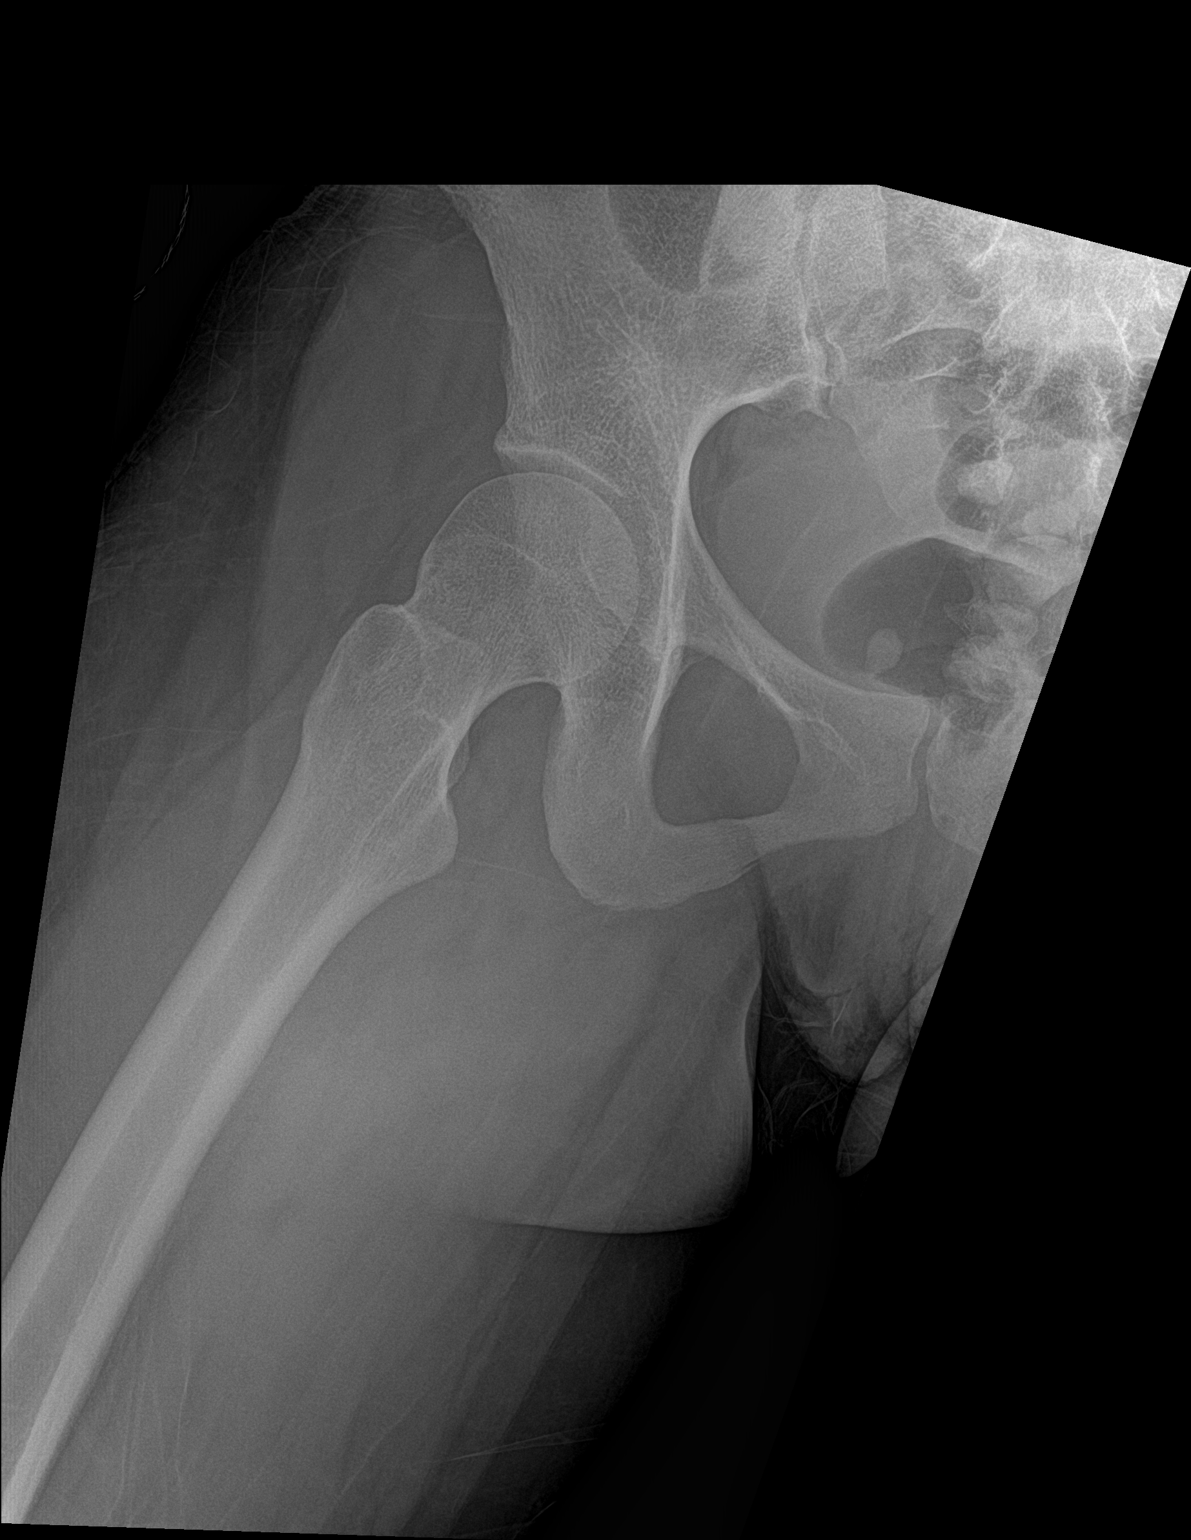

[3 of 3 positions shown; findings below may reference images not displayed]

FINDINGS: Pelvic ring is intact. No acute fracture or dislocation is noted. No
soft tissue abnormality is seen. No erosive changes are noted.
IMPRESSION: No acute abnormality noted.
